# Patient Record
Sex: Female | Born: 1975 | Race: Black or African American | Hispanic: No | Smoking: Never smoker
Health system: Southern US, Community
[De-identification: ages and names within clinical notes are randomized; demographics above are authoritative.]

## PROBLEM LIST (undated history)

## (undated) ENCOUNTER — Emergency Department (HOSPITAL_COMMUNITY): Payer: 59

## (undated) DIAGNOSIS — Z9889 Other specified postprocedural states: Secondary | ICD-10-CM

## (undated) DIAGNOSIS — M5430 Sciatica, unspecified side: Secondary | ICD-10-CM

## (undated) DIAGNOSIS — F419 Anxiety disorder, unspecified: Secondary | ICD-10-CM

## (undated) DIAGNOSIS — R519 Headache, unspecified: Secondary | ICD-10-CM

## (undated) DIAGNOSIS — R51 Headache: Secondary | ICD-10-CM

## (undated) DIAGNOSIS — J45909 Unspecified asthma, uncomplicated: Secondary | ICD-10-CM

## (undated) DIAGNOSIS — T7840XA Allergy, unspecified, initial encounter: Secondary | ICD-10-CM

## (undated) DIAGNOSIS — D649 Anemia, unspecified: Secondary | ICD-10-CM

## (undated) DIAGNOSIS — Z9289 Personal history of other medical treatment: Secondary | ICD-10-CM

## (undated) DIAGNOSIS — R112 Nausea with vomiting, unspecified: Secondary | ICD-10-CM

## (undated) DIAGNOSIS — I10 Essential (primary) hypertension: Secondary | ICD-10-CM

## (undated) HISTORY — PX: LAPAROSCOPY: SHX197

---

## 2008-04-17 ENCOUNTER — Emergency Department: Payer: Self-pay | Admitting: Emergency Medicine

## 2008-08-22 ENCOUNTER — Emergency Department (HOSPITAL_COMMUNITY): Admission: EM | Admit: 2008-08-22 | Discharge: 2008-08-22 | Payer: Self-pay | Admitting: Emergency Medicine

## 2011-05-15 ENCOUNTER — Emergency Department: Payer: Self-pay | Admitting: Internal Medicine

## 2014-02-20 ENCOUNTER — Emergency Department (HOSPITAL_COMMUNITY)
Admission: EM | Admit: 2014-02-20 | Discharge: 2014-02-20 | Disposition: A | Discharge status: Home / Self Care | Payer: 59 | Attending: Emergency Medicine | Admitting: Emergency Medicine

## 2014-02-20 ENCOUNTER — Encounter (HOSPITAL_COMMUNITY): Payer: Self-pay | Admitting: Emergency Medicine

## 2014-02-20 DIAGNOSIS — F19939 Other psychoactive substance use, unspecified with withdrawal, unspecified: Secondary | ICD-10-CM | POA: Insufficient documentation

## 2014-02-20 DIAGNOSIS — F419 Anxiety disorder, unspecified: Secondary | ICD-10-CM

## 2014-02-20 DIAGNOSIS — K219 Gastro-esophageal reflux disease without esophagitis: Secondary | ICD-10-CM | POA: Insufficient documentation

## 2014-02-20 DIAGNOSIS — R519 Headache, unspecified: Secondary | ICD-10-CM

## 2014-02-20 DIAGNOSIS — M5430 Sciatica, unspecified side: Secondary | ICD-10-CM | POA: Insufficient documentation

## 2014-02-20 DIAGNOSIS — I10 Essential (primary) hypertension: Secondary | ICD-10-CM | POA: Insufficient documentation

## 2014-02-20 DIAGNOSIS — Z79899 Other long term (current) drug therapy: Secondary | ICD-10-CM | POA: Insufficient documentation

## 2014-02-20 DIAGNOSIS — Z7982 Long term (current) use of aspirin: Secondary | ICD-10-CM | POA: Insufficient documentation

## 2014-02-20 DIAGNOSIS — Z885 Allergy status to narcotic agent status: Secondary | ICD-10-CM | POA: Insufficient documentation

## 2014-02-20 DIAGNOSIS — R002 Palpitations: Secondary | ICD-10-CM | POA: Insufficient documentation

## 2014-02-20 DIAGNOSIS — R51 Headache: Secondary | ICD-10-CM | POA: Insufficient documentation

## 2014-02-20 DIAGNOSIS — Z881 Allergy status to other antibiotic agents status: Secondary | ICD-10-CM | POA: Insufficient documentation

## 2014-02-20 HISTORY — DX: Sciatica, unspecified side: M54.30

## 2014-02-20 HISTORY — DX: Anxiety disorder, unspecified: F41.9

## 2014-02-20 HISTORY — DX: Essential (primary) hypertension: I10

## 2014-02-20 LAB — CBC WITH DIFFERENTIAL/PLATELET
Basophils Absolute: 0 10*3/uL (ref 0.0–0.1)
Basophils Relative: 0 % (ref 0–1)
EOS ABS: 0.1 10*3/uL (ref 0.0–0.7)
Eosinophils Relative: 1 % (ref 0–5)
HCT: 38.1 % (ref 36.0–46.0)
HEMOGLOBIN: 13.5 g/dL (ref 12.0–15.0)
Lymphocytes Relative: 34 % (ref 12–46)
Lymphs Abs: 3.6 10*3/uL (ref 0.7–4.0)
MCH: 29.5 pg (ref 26.0–34.0)
MCHC: 35.4 g/dL (ref 30.0–36.0)
MCV: 83.4 fL (ref 78.0–100.0)
MONOS PCT: 7 % (ref 3–12)
Monocytes Absolute: 0.7 10*3/uL (ref 0.1–1.0)
NEUTROS PCT: 58 % (ref 43–77)
Neutro Abs: 6.2 10*3/uL (ref 1.7–7.7)
PLATELETS: 290 10*3/uL (ref 150–400)
RBC: 4.57 MIL/uL (ref 3.87–5.11)
RDW: 12.8 % (ref 11.5–15.5)
WBC: 10.6 10*3/uL — ABNORMAL HIGH (ref 4.0–10.5)

## 2014-02-20 LAB — COMPREHENSIVE METABOLIC PANEL
ALBUMIN: 4.2 g/dL (ref 3.5–5.2)
ALK PHOS: 53 U/L (ref 39–117)
ALT: 14 U/L (ref 0–35)
AST: 18 U/L (ref 0–37)
BILIRUBIN TOTAL: 0.5 mg/dL (ref 0.3–1.2)
BUN: 11 mg/dL (ref 6–23)
CHLORIDE: 98 meq/L (ref 96–112)
CO2: 22 mEq/L (ref 19–32)
Calcium: 10.6 mg/dL — ABNORMAL HIGH (ref 8.4–10.5)
Creatinine, Ser: 0.64 mg/dL (ref 0.50–1.10)
GFR calc Af Amer: >90 mL/min (ref >90)
GFR calc non Af Amer: >90 mL/min (ref >90)
Glucose, Bld: 128 mg/dL — ABNORMAL HIGH (ref 70–99)
Potassium: 3.5 mEq/L — ABNORMAL LOW (ref 3.7–5.3)
SODIUM: 139 meq/L (ref 137–147)
Total Protein: 8 g/dL (ref 6.0–8.3)

## 2014-02-20 MED ORDER — IBUPROFEN 400 MG PO TABS: 400.0000 mg | ORAL_TABLET | Freq: Four times a day (QID) | ORAL | Status: DC | PRN

## 2014-02-20 MED ORDER — OMEPRAZOLE 20 MG PO CPDR: 20.0000 mg | DELAYED_RELEASE_CAPSULE | Freq: Every day | ORAL | Status: DC

## 2014-02-20 MED ORDER — GI COCKTAIL ~~LOC~~
30.0000 mL | Freq: Once | ORAL | Status: AC
  Administered 2014-02-20: 30 mL via ORAL
  Filled 2014-02-20: qty 30

## 2014-02-20 MED ORDER — LORAZEPAM 1 MG PO TABS: 1.0000 mg | ORAL_TABLET | Freq: Three times a day (TID) | ORAL | Status: DC | PRN

## 2014-02-20 MED ORDER — IBUPROFEN 200 MG PO TABS
600.0000 mg | ORAL_TABLET | Freq: Once | ORAL | Status: AC
  Administered 2014-02-20: 600 mg via ORAL
  Filled 2014-02-20: qty 3

## 2014-02-20 NOTE — ED Notes (Signed)
Pt. woke up this morning with palpitations , hand tremors , dry mouth and anxiety , respirations unlabored / no chest pain .

## 2014-02-20 NOTE — Discharge Instructions (Signed)
Headaches, Frequently Asked Questions MIGRAINE HEADACHES Q: What is migraine? What causes it? How can I treat it? A: Generally, migraine headaches begin as a dull ache. Then they develop into a constant, throbbing, and pulsating pain. You may experience pain at the temples. You may experience pain at the front or back of one or both sides of the head. The pain is usually accompanied by a combination of:  Nausea.  Vomiting.  Sensitivity to light and noise. Some people (about 15%) experience an aura (see below) before an attack. The cause of migraine is believed to be chemical reactions in the brain. Treatment for migraine may include over-the-counter or prescription medications. It may also include self-help techniques. These include relaxation training and biofeedback.  Q: What is an aura? A: About 15% of people with migraine get an "aura". This is a sign of neurological symptoms that occur before a migraine headache. You may see wavy or jagged lines, dots, or flashing lights. You might experience tunnel vision or blind spots in one or both eyes. The aura can include visual or auditory hallucinations (something imagined). It may include disruptions in smell (such as strange odors), taste or touch. Other symptoms include:  Numbness.  A "pins and needles" sensation.  Difficulty in recalling or speaking the correct word. These neurological events may last as long as 60 minutes. These symptoms will fade as the headache begins. Q: What is a trigger? A: Certain physical or environmental factors can lead to or "trigger" a migraine. These include:  Foods.  Hormonal changes.  Weather.  Stress. It is important to remember that triggers are different for everyone. To help prevent migraine attacks, you need to figure out which triggers affect you. Keep a headache diary. This is a good way to track triggers. The diary will help you talk to your healthcare professional about your condition. Q: Does  weather affect migraines? A: Bright sunshine, hot, humid conditions, and drastic changes in barometric pressure may lead to, or "trigger," a migraine attack in some people. But studies have shown that weather does not act as a trigger for everyone with migraines. Q: What is the link between migraine and hormones? A: Hormones start and regulate many of your body's functions. Hormones keep your body in balance within a constantly changing environment. The levels of hormones in your body are unbalanced at times. Examples are during menstruation, pregnancy, or menopause. That can lead to a migraine attack. In fact, about three quarters of all women with migraine report that their attacks are related to the menstrual cycle.  Q: Is there an increased risk of stroke for migraine sufferers? A: The likelihood of a migraine attack causing a stroke is very remote. That is not to say that migraine sufferers cannot have a stroke associated with their migraines. In persons under age 53, the most common associated factor for stroke is migraine headache. But over the course of a person's normal life span, the occurrence of migraine headache may actually be associated with a reduced risk of dying from cerebrovascular disease due to stroke.  Q: What are acute medications for migraine? A: Acute medications are used to treat the pain of the headache after it has started. Examples over-the-counter medications, NSAIDs, ergots, and triptans.  Q: What are the triptans? A: Triptans are the newest class of abortive medications. They are specifically targeted to treat migraine. Triptans are vasoconstrictors. They moderate some chemical reactions in the brain. The triptans work on receptors in your brain. Triptans help  to restore the balance of a neurotransmitter called serotonin. Fluctuations in levels of serotonin are thought to be a main cause of migraine.  °Q: Are over-the-counter medications for migraine effective? °A:  Over-the-counter, or "OTC," medications may be effective in relieving mild to moderate pain and associated symptoms of migraine. But you should see your caregiver before beginning any treatment regimen for migraine.  °Q: What are preventive medications for migraine? °A: Preventive medications for migraine are sometimes referred to as "prophylactic" treatments. They are used to reduce the frequency, severity, and length of migraine attacks. Examples of preventive medications include antiepileptic medications, antidepressants, beta-blockers, calcium channel blockers, and NSAIDs (nonsteroidal anti-inflammatory drugs). °Q: Why are anticonvulsants used to treat migraine? °A: During the past few years, there has been an increased interest in antiepileptic drugs for the prevention of migraine. They are sometimes referred to as "anticonvulsants". Both epilepsy and migraine may be caused by similar reactions in the brain.  °Q: Why are antidepressants used to treat migraine? °A: Antidepressants are typically used to treat people with depression. They may reduce migraine frequency by regulating chemical levels, such as serotonin, in the brain.  °Q: What alternative therapies are used to treat migraine? °A: The term "alternative therapies" is often used to describe treatments considered outside the scope of conventional Western medicine. Examples of alternative therapy include acupuncture, acupressure, and yoga. Another common alternative treatment is herbal therapy. Some herbs are believed to relieve headache pain. Always discuss alternative therapies with your caregiver before proceeding. Some herbal products contain arsenic and other toxins. °TENSION HEADACHES °Q: What is a tension-type headache? What causes it? How can I treat it? °A: Tension-type headaches occur randomly. They are often the result of temporary stress, anxiety, fatigue, or anger. Symptoms include soreness in your temples, a tightening band-like sensation  around your head (a "vice-like" ache). Symptoms can also include a pulling feeling, pressure sensations, and contracting head and neck muscles. The headache begins in your forehead, temples, or the back of your head and neck. Treatment for tension-type headache may include over-the-counter or prescription medications. Treatment may also include self-help techniques such as relaxation training and biofeedback. °CLUSTER HEADACHES °Q: What is a cluster headache? What causes it? How can I treat it? °A: Cluster headache gets its name because the attacks come in groups. The pain arrives with little, if any, warning. It is usually on one side of the head. A tearing or bloodshot eye and a runny nose on the same side of the headache may also accompany the pain. Cluster headaches are believed to be caused by chemical reactions in the brain. They have been described as the most severe and intense of any headache type. Treatment for cluster headache includes prescription medication and oxygen. °SINUS HEADACHES °Q: What is a sinus headache? What causes it? How can I treat it? °A: When a cavity in the bones of the face and skull (a sinus) becomes inflamed, the inflammation will cause localized pain. This condition is usually the result of an allergic reaction, a tumor, or an infection. If your headache is caused by a sinus blockage, such as an infection, you will probably have a fever. An x-ray will confirm a sinus blockage. Your caregiver's treatment might include antibiotics for the infection, as well as antihistamines or decongestants.  °REBOUND HEADACHES °Q: What is a rebound headache? What causes it? How can I treat it? °A: A pattern of taking acute headache medications too often can lead to a condition known as "rebound headache."   A pattern of taking too much headache medication includes taking it more than 2 days per week or in excessive amounts. That means more than the label or a caregiver advises. With rebound  headaches, your medications not only stop relieving pain, they actually begin to cause headaches. Doctors treat rebound headache by tapering the medication that is being overused. Sometimes your caregiver will gradually substitute a different type of treatment or medication. Stopping may be a challenge. Regularly overusing a medication increases the potential for serious side effects. Consult a caregiver if you regularly use headache medications more than 2 days per week or more than the label advises. ADDITIONAL QUESTIONS AND ANSWERS Q: What is biofeedback? A: Biofeedback is a self-help treatment. Biofeedback uses special equipment to monitor your body's involuntary physical responses. Biofeedback monitors:  Breathing.  Pulse.  Heart rate.  Temperature.  Muscle tension.  Brain activity. Biofeedback helps you refine and perfect your relaxation exercises. You learn to control the physical responses that are related to stress. Once the technique has been mastered, you do not need the equipment any more. Q: Are headaches hereditary? A: Four out of five (80%) of people that suffer report a family history of migraine. Scientists are not sure if this is genetic or a family predisposition. Despite the uncertainty, a child has a 50% chance of having migraine if one parent suffers. The child has a 75% chance if both parents suffer.  Q: Can children get headaches? A: By the time they reach high school, most young people have experienced some type of headache. Many safe and effective approaches or medications can prevent a headache from occurring or stop it after it has begun.  Q: What type of doctor should I see to diagnose and treat my headache? A: Start with your primary caregiver. Discuss his or her experience and approach to headaches. Discuss methods of classification, diagnosis, and treatment. Your caregiver may decide to recommend you to a headache specialist, depending upon your symptoms or other  physical conditions. Having diabetes, allergies, etc., may require a more comprehensive and inclusive approach to your headache. The National Headache Foundation will provide, upon request, a list of [facility] physician members in your state. Document Released: 02/05/2004 Document Revised: 02/07/2012 Document Reviewed: 07/15/2008 [facility] Patient Information 2014 [place]. Generalized Anxiety Disorder Generalized anxiety disorder (GAD) is a mental disorder. It interferes with life functions, including relationships, work, and school. GAD is different from normal anxiety, which everyone experiences at some point in their lives in response to specific life events and activities. Normal anxiety actually helps [place] prepare for and get through these life events and activities. Normal anxiety goes away after the event or activity is over.  GAD causes anxiety that is not necessarily related to specific events or activities. It also causes excess anxiety in proportion to specific events or activities. The anxiety associated with GAD is also difficult to control. GAD can vary from mild to severe. People with severe GAD can have intense waves of anxiety with physical symptoms (panic attacks).  SYMPTOMS The anxiety and worry associated with GAD are difficult to control. This anxiety and worry are related to many life events and activities and also occur more days than not for 6 months or longer. People with GAD also have three or more of the following symptoms (one or more in children):  Restlessness.   Fatigue.  Difficulty concentrating.   Irritability.  Muscle tension.  Difficulty sleeping or unsatisfying sleep. DIAGNOSIS GAD is diagnosed through an  assessment by your caregiver. Your caregiver will ask you questions aboutyour mood,physical symptoms, and events in your life. Your caregiver may ask you about your medical history and use of alcohol or drugs, including prescription medications. Your  caregiver may also do a physical exam and blood tests. Certain medical conditions and the use of certain substances can cause symptoms similar to those associated with GAD. Your caregiver may refer you to a mental health specialist for further evaluation. TREATMENT The following therapies are usually used to treat GAD:   Medication Antidepressant medication usually is prescribed for long-term daily control. Antianxiety medications may be added in severe cases, especially when panic attacks occur.   Talk therapy (psychotherapy) Certain types of talk therapy can be helpful in treating GAD by providing support, education, and guidance. A form of talk therapy called cognitive behavioral therapy can teach you healthy ways to think about and react to daily life events and activities.  Stress managementtechniques These include yoga, meditation, and exercise and can be very helpful when they are practiced regularly. A mental health specialist can help determine which treatment is best for you. Some people see improvement with one therapy. However, other people require a combination of therapies. Document Released: 03/12/2013 Document Reviewed: 03/12/2013 [facility] Patient Information 2014 [place], [place]. Diet for Gastroesophageal Reflux Disease, Adult Reflux (acid reflux) is when acid from your stomach flows up into the esophagus. When acid comes in contact with the esophagus, the acid causes irritation and soreness (inflammation) in the esophagus. When reflux happens often or so severely that it causes damage to the esophagus, it is called gastroesophageal reflux disease (GERD). Nutrition therapy can help ease the discomfort of GERD. FOODS OR DRINKS TO AVOID OR LIMIT  Smoking or chewing tobacco. Nicotine is one of the most potent stimulants to acid production in the gastrointestinal tract.  Caffeinated and decaffeinated coffee and black tea.  Regular or low-calorie carbonated beverages or energy  drinks (caffeine-free carbonated beverages are allowed).   Strong spices, such as black pepper, white pepper, red pepper, cayenne, curry powder, and chili powder.  Peppermint or spearmint.  Chocolate.  High-fat foods, including meats and fried foods. Extra added fats including oils, butter, salad dressings, and nuts. Limit these to less than 8 tsp per day.  Fruits and vegetables if they are not tolerated, such as citrus fruits or tomatoes.  Alcohol.  Any food that seems to aggravate your condition. If you have questions regarding your diet, call your caregiver or a registered dietitian. OTHER THINGS THAT MAY HELP GERD INCLUDE:   Eating your meals slowly, in a relaxed setting.  Eating 5 to 6 small meals per day instead of 3 large meals.  Eliminating food for a period of time if it causes distress.  Not lying down until 3 hours after eating a meal.  Keeping the head of your bed raised 6 to 9 inches (15 to 23 cm) by using a foam wedge or blocks under the legs of the bed. Lying flat may make symptoms worse.  Being physically active. Weight loss may be helpful in reducing reflux in overweight or obese adults.  Wear loose fitting clothing EXAMPLE MEAL PLAN This meal plan is approximately 2,000 calories based on [email or web address] meal planning guidelines. Breakfast   cup cooked oatmeal.  1 cup strawberries.  1 cup low-fat milk.  1 oz almonds. Snack  1 cup cucumber slices.  6 oz yogurt (made from low-fat or fat-free milk). Lunch  2 slice whole-wheat bread.  2 oz  sliced [place].  2 tsp mayonnaise.  1 cup blueberries.  1 cup snap peas. Snack  6 whole-wheat crackers.  1 oz string cheese. Dinner   cup brown rice.  1 cup mixed veggies.  1 tsp olive oil.  3 oz grilled fish. Document Released: 11/15/2005 Document Revised: 02/07/2012 Document Reviewed: 10/01/2011 [facility] Patient Information 2014 [place], [place].

## 2014-02-21 NOTE — ED Provider Notes (Signed)
CSN: [id number]     Arrival date & time 02/20/14  0356 History   First MD Initiated Contact with Patient 02/20/14 [phone]     Chief Complaint  Patient presents with  . Palpitations  . Anxiety     (Consider location/radiation/quality/duration/timing/severity/associated sxs/prior Treatment) HPI Comments: Pt with hx pf HTN, anxiety - being weaned off of nortriptyline, comes in with cc of palpitations and tremors. States tat she she woke up in the middle of the sleep with palpitations. Pt is also feeling tremulous and jittery. Hx of anxiety - but typically she gets chest tightness.  Patient is a 38 y.o. female presenting with palpitations and anxiety. The history is provided by the patient.  Palpitations Associated symptoms: no chest pain, no dizziness, no nausea, no shortness of breath and no vomiting   Anxiety Pertinent negatives include no chest pain, no abdominal pain, no headaches and no shortness of breath.    Past Medical History  Diagnosis Date  . Hypertension   . Anxiety   . Sciatica    Past Surgical History  Procedure Laterality Date  . Cesarean section    . Laparoscopy     No family history on file. History  Substance Use Topics  . Smoking status: Never Smoker   . Smokeless tobacco: Not on file  . Alcohol Use: No   OB History   Grav Para Term Preterm Abortions TAB SAB Ect Mult Living                 Review of Systems  Constitutional: Positive for activity change.  Respiratory: Negative for chest tightness and shortness of breath.   Cardiovascular: Positive for palpitations. Negative for chest pain.  Gastrointestinal: Negative for nausea, vomiting and abdominal pain.  Genitourinary: Negative for dysuria.  Musculoskeletal: Negative for neck pain.  Neurological: Negative for dizziness and headaches.  Psychiatric/Behavioral: The patient is nervous/anxious.       Allergies  Shellfish allergy; Codeine; Doxycycline; and Cefzil  Home Medications   Current  Outpatient Rx  Name  Route  Sig  Dispense  Refill  . aspirin EC 81 MG tablet   Oral   Take 200 mg by mouth daily.         . cetirizine (ZYRTEC) 10 MG tablet   Oral   Take 10 mg by mouth daily.         Marland Kitchen etodolac (LODINE) 500 MG tablet   Oral   Take 500 mg by mouth 2 (two) times daily.          Marland Kitchen VITAMIN D, CHOLECALCIFEROL, PO   Oral   Take 5,000 Units by mouth 2 (two) times daily.         Marland Kitchen ibuprofen (ADVIL,MOTRIN) 400 MG tablet   Oral   Take 1 tablet (400 mg total) by mouth every 6 (six) hours as needed.   30 tablet   0   . LORazepam (ATIVAN) 1 MG tablet   Oral   Take 1 tablet (1 mg total) by mouth 3 (three) times daily as needed for anxiety.   10 tablet   0   . nortriptyline (PAMELOR) 25 MG capsule   Oral   Take 25 mg by mouth at bedtime.          Marland Kitchen omeprazole (PRILOSEC) 20 MG capsule   Oral   Take 1 capsule (20 mg total) by mouth daily.   30 capsule   0    BP 138/97  Pulse 81  Temp(Src) 98.4 F (36.9  C) (Oral)  Resp 16  SpO2 98%  LMP 02/20/2014 Physical Exam  Nursing note and vitals reviewed. Constitutional: She is oriented to person, place, and time. She appears well-developed and well-nourished.  HENT:  Head: Normocephalic and atraumatic.  Eyes: EOM are normal. Pupils are equal, round, and reactive to light.  Neck: Neck supple.  Cardiovascular: Normal rate, regular rhythm and normal heart sounds.   No murmur heard. Pulmonary/Chest: Effort normal. No respiratory distress.  Abdominal: Soft. She exhibits no distension. There is no tenderness. There is no rebound and no guarding.  Neurological: She is alert and oriented to person, place, and time.  Skin: Skin is warm and dry.    ED Course  Procedures (including critical care time) Labs Review Labs Reviewed  CBC WITH DIFFERENTIAL - Abnormal; Notable for the following:    WBC 10.6 (*)    All other components within normal limits  COMPREHENSIVE METABOLIC PANEL - Abnormal; Notable for the  following:    Potassium 3.5 (*)    Glucose, Bld 128 (*)    Calcium 10.6 (*)    All other components within normal limits   Imaging Review No results found.   EKG Interpretation   Date/Time:  Wednesday February 20 2014 04:00:45 EDT Ventricular Rate:  143 PR Interval:  126 QRS Duration: 76 QT Interval:  292 QTC Calculation: 450 R Axis:   78 Text Interpretation:  Sinus tachycardia with occasional Premature  ventricular complexes Otherwise normal ECG Confirmed by Cashlynn Yearwood, MD,  Thelma Comp [phone]) on 02/20/2014 5:57:20 AM      MDM   Final diagnoses:  Anxiety  Medication withdrawal  Headache  GERD (gastroesophageal reflux disease)    Pt comes in with cc of anxiety attack. She has hx of anxiety - sx atypical for her though. Of note however, she is being weaned off of nortriptyline - and the withdrawals can present with anxiety.  During my evaluation, she is no longer tachycardic - however, when she first arrived, her HR was in the 140s. She feels a lot better - but also complains of some heart burn. No hx of PE, DVT, and no risk factors for the same, also no hx of thyroid disorders, substance or stimulant abuse.  Varney Biles, MD 02/21/14 [phone]

## 2016-08-17 ENCOUNTER — Encounter: Payer: Self-pay | Admitting: Emergency Medicine

## 2016-08-17 ENCOUNTER — Emergency Department
Admission: EM | Admit: 2016-08-17 | Discharge: 2016-08-17 | Disposition: A | Discharge status: Home / Self Care | Payer: 59 | Attending: Emergency Medicine | Admitting: Emergency Medicine

## 2016-08-17 ENCOUNTER — Emergency Department: Payer: 59

## 2016-08-17 DIAGNOSIS — Z79899 Other long term (current) drug therapy: Secondary | ICD-10-CM | POA: Insufficient documentation

## 2016-08-17 DIAGNOSIS — R109 Unspecified abdominal pain: Secondary | ICD-10-CM

## 2016-08-17 DIAGNOSIS — Z7982 Long term (current) use of aspirin: Secondary | ICD-10-CM | POA: Diagnosis not present

## 2016-08-17 DIAGNOSIS — Z791 Long term (current) use of non-steroidal anti-inflammatories (NSAID): Secondary | ICD-10-CM | POA: Insufficient documentation

## 2016-08-17 DIAGNOSIS — R319 Hematuria, unspecified: Secondary | ICD-10-CM | POA: Diagnosis not present

## 2016-08-17 DIAGNOSIS — R1032 Left lower quadrant pain: Secondary | ICD-10-CM | POA: Diagnosis not present

## 2016-08-17 DIAGNOSIS — I10 Essential (primary) hypertension: Secondary | ICD-10-CM | POA: Insufficient documentation

## 2016-08-17 DIAGNOSIS — R3915 Urgency of urination: Secondary | ICD-10-CM | POA: Diagnosis not present

## 2016-08-17 LAB — URINALYSIS COMPLETE WITH MICROSCOPIC (ARMC ONLY)
Bacteria, UA: NONE SEEN
Bilirubin Urine: NEGATIVE
GLUCOSE, UA: NEGATIVE mg/dL
Ketones, ur: NEGATIVE mg/dL
Leukocytes, UA: NEGATIVE
NITRITE: NEGATIVE
Protein, ur: NEGATIVE mg/dL
SPECIFIC GRAVITY, URINE: 1.023 (ref 1.005–1.030)
pH: 5 (ref 5.0–8.0)

## 2016-08-17 LAB — PREGNANCY, URINE: Preg Test, Ur: NEGATIVE

## 2016-08-17 MED ORDER — KETOROLAC TROMETHAMINE 30 MG/ML IJ SOLN
INTRAMUSCULAR | Status: AC
  Filled 2016-08-17: qty 1

## 2016-08-17 MED ORDER — NAPROXEN 500 MG PO TABS
500.0000 mg | ORAL_TABLET | Freq: Two times a day (BID) | ORAL | 0 refills | Status: DC
Start: 2016-08-17 — End: 2016-10-15

## 2016-08-17 MED ORDER — ONDANSETRON 4 MG PO TBDP: 4.0000 mg | ORAL_TABLET | Freq: Three times a day (TID) | ORAL | 0 refills | Status: DC | PRN

## 2016-08-17 MED ORDER — KETOROLAC TROMETHAMINE 60 MG/2ML IM SOLN
15.0000 mg | Freq: Once | INTRAMUSCULAR | Status: AC
  Administered 2016-08-17: 15 mg via INTRAMUSCULAR

## 2016-08-17 NOTE — ED Notes (Signed)
Pt discharged to home.  Discharge instructions reviewed.  Verbalized understanding.  No questions or concerns at this time.  Teach back verified.  Pt in NAD.  No items left in ED.   

## 2016-08-17 NOTE — ED Notes (Signed)
Lab called for urine preg add on

## 2016-08-17 NOTE — Discharge Instructions (Signed)
Your CT scan revealed: IMPRESSION:  1. No renal, ureteral or bladder calculi or mass.  2. Scattered descending colon and sigmoid colon diverticulosis but  no findings for acute diverticulitis.  3. 5 cm fibroid with mild mass effect on the endometrium.  4. Simple appearing left ovarian cyst.  5. Normal terminal ileum and appendix.  6. Markedly contracted gallbladder.   Please follow up with your doctor for continued monitoring of your symptoms.

## 2016-08-17 NOTE — ED Notes (Addendum)
Pt states left lower abd/ flank pain, denies any urinary symptoms, pt awake and alert in no acute distress

## 2016-08-17 NOTE — ED Provider Notes (Signed)
[facility] Emergency Department Provider Note  ____________________________________________  Time seen: Approximately 7:04 PM  I have reviewed the triage vital signs and the nursing notes.   HISTORY  Chief Complaint Flank Pain    HPI Allison Wade is a 40 y.o. female who complains of left flank pain that started yesterday, constant waxing and waning, radiating to the left lower quadrant. Associated with some urinary urgency but no dysuria. No fevers chills or sweats. No nausea vomiting or diarrhea. Pain is sharp. No aggravating or alleviating factors.     Past Medical History:  Diagnosis Date  . Anxiety   . Hypertension   . Sciatica      There are no active problems to display for this patient.    Past Surgical History:  Procedure Laterality Date  . CESAREAN SECTION    . LAPAROSCOPY       Prior to Admission medications   Medication Sig Start Date End Date Taking? Authorizing Provider  aspirin EC 81 MG tablet Take 200 mg by mouth daily.    Historical Provider, MD  cetirizine (ZYRTEC) 10 MG tablet Take 10 mg by mouth daily.    Historical Provider, MD  etodolac (LODINE) 500 MG tablet Take 500 mg by mouth 2 (two) times daily.  01/29/14   Historical Provider, MD  ibuprofen (ADVIL,MOTRIN) 400 MG tablet Take 1 tablet (400 mg total) by mouth every 6 (six) hours as needed. 02/20/14   Varney Biles, MD  LORazepam (ATIVAN) 1 MG tablet Take 1 tablet (1 mg total) by mouth 3 (three) times daily as needed for anxiety. 02/20/14   Varney Biles, MD  naproxen (NAPROSYN) 500 MG tablet Take 1 tablet (500 mg total) by mouth 2 (two) times daily with a meal. 08/17/16   Carrie Mew, MD  nortriptyline (PAMELOR) 25 MG capsule Take 25 mg by mouth at bedtime.  02/12/14   Historical Provider, MD  omeprazole (PRILOSEC) 20 MG capsule Take 1 capsule (20 mg total) by mouth daily. 02/20/14   Varney Biles, MD  ondansetron (ZOFRAN ODT) 4 MG disintegrating tablet Take 1  tablet (4 mg total) by mouth every 8 (eight) hours as needed for nausea or vomiting. 08/17/16   Carrie Mew, MD  VITAMIN D, CHOLECALCIFEROL, PO Take 5,000 Units by mouth 2 (two) times daily.    Historical Provider, MD     Allergies Shellfish allergy; Codeine; Doxycycline; and Cefzil [cefprozil]   History reviewed. No pertinent family history.  Social History Social History  Substance Use Topics  . Smoking status: Never Smoker  . Smokeless tobacco: Never Used  . Alcohol use No    Review of Systems  Constitutional:   No fever or chills.   Cardiovascular:   No chest pain. Respiratory:   No dyspnea or cough. Gastrointestinal:   Left flank pain as above without vomiting and diarrhea.  Genitourinary:   Negative for dysuria or difficulty urinating.positive urgency  10-point ROS otherwise negative.  ____________________________________________   PHYSICAL EXAM:  VITAL SIGNS: ED Triage Vitals  Enc Vitals Group     BP 08/17/16 1527 (!) 148/94     Pulse Rate 08/17/16 1527 85     Resp 08/17/16 1527 18     Temp 08/17/16 1527 98.4 F (36.9 C)     Temp Source 08/17/16 1527 Oral     SpO2 08/17/16 1527 100 %     Weight 08/17/16 1520 193 lb (87.5 kg)     Height 08/17/16 1520 5\' 6"  (1.676 m)  Head Circumference --      Peak Flow --      Pain Score 08/17/16 1521 8     Pain Loc --      Pain Edu? --      Excl. in [place]? --     Vital signs reviewed, nursing assessments reviewed.   Constitutional:   Alert and oriented. Well appearing and in no distress. Eyes:   No scleral icterus. No conjunctival pallor. PERRL. EOMI.  No nystagmus. ENT   Head:   Normocephalic and atraumatic.   Nose:   No congestion/rhinnorhea. No septal hematoma   Mouth/Throat:   MMM, no pharyngeal erythema. No peritonsillar mass.    Neck:   No stridor. No SubQ emphysema. No meningismus. Hematological/Lymphatic/Immunilogical:   No cervical lymphadenopathy. Cardiovascular:   RRR. Symmetric  bilateral radial and DP pulses.  No murmurs.  Respiratory:   Normal respiratory effort without tachypnea nor retractions. Breath sounds are clear and equal bilaterally. No wheezes/rales/rhonchi. Gastrointestinal:   Soft with mild left lower quadrant tendernesser. Non distended. There is no CVA tenderness.  No rebound, rigidity, or guarding. Genitourinary:   deferred Musculoskeletal:   Nontender with normal range of motion in all extremities. No joint effusions.  No lower extremity tenderness.  No edema. Neurologic:   Normal speech and language.  CN 2-10 normal. Motor grossly intact. No gross focal neurologic deficits are appreciated.  Skin:    Skin is warm, dry and intact. No rash noted.  No petechiae, purpura, or bullae.  ____________________________________________    LABS (pertinent positives/negatives) (all labs ordered are listed, but only abnormal results are displayed) Labs Reviewed  URINALYSIS COMPLETEWITH MICROSCOPIC ([place]) - Abnormal; Notable for the following:       Result Value   Color, Urine YELLOW (*)    APPearance CLEAR (*)    Hgb urine dipstick 3+ (*)    Squamous Epithelial / LPF 0-5 (*)    All other components within normal limits  URINE CULTURE  PREGNANCY, URINE   ____________________________________________   EKG    ____________________________________________    RADIOLOGY  CT abdomen and pelvis reveals a uterine fibroid, otherwise unremarkable.  ____________________________________________   PROCEDURES Procedures  ____________________________________________   INITIAL IMPRESSION / ASSESSMENT AND PLAN / ED COURSE  Pertinent labs & imaging results that were available during my care of the patient were reviewed by me and considered in my medical decision making (see chart for details).  Patient well appearing no acute distress. Presents with left flank and left lower quadrant abdominal pain. Found to have a uterine fibroid and microscopic  hematuria on workup. Exam is otherwise reassuring, vital signs unremarkable, patient very well appearing and in good spirits. We'll have the patient take NSAIDs for her pain, follow-up with primary care. Informed about hematuria and need for repeat testing once symptoms have resolved. Informed about CT findings. Return precautions given.     Clinical Course   ____________________________________________   FINAL CLINICAL IMPRESSION(S) / ED DIAGNOSES  Final diagnoses:  Left flank pain  Hematuria       Portions of this note were generated with dragon dictation software. Dictation errors may occur despite best attempts at proofreading.    Carrie Mew, MD 08/17/16 [phone]

## 2016-08-17 NOTE — ED Triage Notes (Signed)
Pt to ed with c/o left flank pain that radiates to left groin area.  Pt reports acute onset last night.

## 2016-08-19 LAB — URINE CULTURE: Culture: NO GROWTH

## 2016-09-02 ENCOUNTER — Other Ambulatory Visit: Payer: Self-pay | Admitting: Gynecology

## 2016-09-02 DIAGNOSIS — R928 Other abnormal and inconclusive findings on diagnostic imaging of breast: Secondary | ICD-10-CM

## 2016-09-07 ENCOUNTER — Ambulatory Visit
Admission: RE | Admit: 2016-09-07 | Discharge: 2016-09-07 | Disposition: A | Discharge status: Home / Self Care | Payer: 59 | Source: Ambulatory Visit | Attending: Gynecology | Admitting: Gynecology

## 2016-09-07 ENCOUNTER — Other Ambulatory Visit: Payer: Self-pay | Admitting: Gynecology

## 2016-09-07 DIAGNOSIS — R928 Other abnormal and inconclusive findings on diagnostic imaging of breast: Secondary | ICD-10-CM

## 2016-09-07 DIAGNOSIS — N6310 Unspecified lump in the right breast, unspecified quadrant: Secondary | ICD-10-CM

## 2016-09-08 ENCOUNTER — Other Ambulatory Visit: Payer: Self-pay | Admitting: Gynecology

## 2016-09-08 DIAGNOSIS — N6310 Unspecified lump in the right breast, unspecified quadrant: Secondary | ICD-10-CM

## 2016-09-09 ENCOUNTER — Ambulatory Visit
Admission: RE | Admit: 2016-09-09 | Discharge: 2016-09-09 | Disposition: A | Discharge status: Home / Self Care | Payer: 59 | Source: Ambulatory Visit | Attending: Gynecology | Admitting: Gynecology

## 2016-09-09 DIAGNOSIS — N6310 Unspecified lump in the right breast, unspecified quadrant: Secondary | ICD-10-CM

## 2016-10-19 NOTE — Patient Instructions (Signed)
Your procedure is scheduled on:  Wednesday, Dec. 6, 2017  Enter through the [employer] [facility] at:  6:00 AM  Pick up the phone at the desk and dial [phone].  Call this number if you have problems the morning of surgery: [phone].  Remember: Do NOT eat food or drink after:  Midnight Tuesday, Dec. 5, 2017  Take these medicines the morning of surgery with a SIP OF WATER:  None  Stop ALL herbal medications at this time   Do NOT wear jewelry (body piercing), metal hair clips/bobby pins, make-up, or nail polish. Do NOT wear lotions, powders, or perfumes.  You may wear deodorant. Do NOT shave for 48 hours prior to surgery. Do NOT bring valuables to the hospital. Contacts, dentures, or bridgework may not be worn into surgery.  Leave suitcase in car.  After surgery it may be brought to your room.  For patients admitted to the hospital, checkout time is 11:00 AM the day of discharge.

## 2016-10-20 ENCOUNTER — Encounter (HOSPITAL_COMMUNITY): Payer: Self-pay

## 2016-10-20 ENCOUNTER — Encounter (INDEPENDENT_AMBULATORY_CARE_PROVIDER_SITE_OTHER): Payer: Self-pay

## 2016-10-20 ENCOUNTER — Encounter (HOSPITAL_COMMUNITY)
Admission: RE | Admit: 2016-10-20 | Discharge: 2016-10-20 | Disposition: A | Discharge status: Home / Self Care | Payer: 59 | Source: Ambulatory Visit | Attending: Obstetrics and Gynecology | Admitting: Obstetrics and Gynecology

## 2016-10-20 DIAGNOSIS — Z01812 Encounter for preprocedural laboratory examination: Secondary | ICD-10-CM | POA: Insufficient documentation

## 2016-10-20 HISTORY — DX: Anemia, unspecified: D64.9

## 2016-10-20 HISTORY — DX: Headache: R51

## 2016-10-20 HISTORY — DX: Headache, unspecified: R51.9

## 2016-10-20 HISTORY — DX: Personal history of other medical treatment: Z92.89

## 2016-10-20 HISTORY — DX: Nausea with vomiting, unspecified: Z98.890

## 2016-10-20 HISTORY — DX: Allergy, unspecified, initial encounter: T78.40XA

## 2016-10-20 HISTORY — DX: Other specified postprocedural states: R11.2

## 2016-10-20 LAB — TYPE AND SCREEN
ABO/RH(D): O POS
Antibody Screen: NEGATIVE

## 2016-10-20 LAB — CBC
HCT: 30.1 % — ABNORMAL LOW (ref 36.0–46.0)
Hemoglobin: 9.5 g/dL — ABNORMAL LOW (ref 12.0–15.0)
MCH: 22.8 pg — AB (ref 26.0–34.0)
MCHC: 31.6 g/dL (ref 30.0–36.0)
MCV: 72.4 fL — AB (ref 78.0–100.0)
PLATELETS: 269 10*3/uL (ref 150–400)
RBC: 4.16 MIL/uL (ref 3.87–5.11)
RDW: 15.5 % (ref 11.5–15.5)
WBC: 9.2 10*3/uL (ref 4.0–10.5)

## 2016-10-20 LAB — BASIC METABOLIC PANEL
Anion gap: 5 (ref 5–15)
BUN: 14 mg/dL (ref 6–20)
CHLORIDE: 108 mmol/L (ref 101–111)
CO2: 25 mmol/L (ref 22–32)
CREATININE: 0.68 mg/dL (ref 0.44–1.00)
Calcium: 9.2 mg/dL (ref 8.9–10.3)
GFR calc Af Amer: >60 mL/min (ref >60)
GFR calc non Af Amer: >60 mL/min (ref >60)
Glucose, Bld: 101 mg/dL — ABNORMAL HIGH (ref 65–99)
Potassium: 3.9 mmol/L (ref 3.5–5.1)
SODIUM: 138 mmol/L (ref 135–145)

## 2016-10-20 LAB — ABO/RH: ABO/RH(D): O POS

## 2016-11-02 MED ORDER — GENTAMICIN SULFATE 40 MG/ML IJ SOLN
INTRAVENOUS | Status: AC
  Administered 2016-11-03: 115 mL via INTRAVENOUS
  Filled 2016-11-02: qty 9

## 2016-11-02 NOTE — H&P (Signed)
Allison Wade is an 40 y.o. female. She was seen in September in our office by Lesia Hausen, NP as a new patient.  Recently had abdominal pain and was seen at [facility]- had CT-showed 3.5cm fibroid. Hx of cyst. Mother and sister had fibroids with hysterectomy. She is not having abdominal pain now. She reports monthly cycles that last 7-9 days with clots and cramping.  She is not opposed to surgery but would consider other treatments. Has 3 children, hx of 2 C-Sections and BTL. Is tired of bleeding.  She had saline infusion ultrasound in the office, normal endometrial cavity but has a 3 cm intramural myoma, endometrial biopsy was benign.  All medical and surgical options were discussed, she wants to proceed with definitive surgical treatment.  Pertinent Gynecological History: Last mammogram: abnormal: needed f/u right breast Date: 07/2016 Last pap: normal Date: 07/2016 OB History: G2, P2002   Menstrual History: Patient's last menstrual period was 09/26/2016 (approximate).    Past Medical History:  Diagnosis Date  . Allergy   . Anemia   . Anxiety   . Headache    sinus induced migraines  . History of blood transfusion   . Hypertension    no medication, patient stated blood pressure was elevated at MD's office after hearing about the death of her best friend that morning  . PONV (postoperative nausea and vomiting)   . Sciatica     Past Surgical History:  Procedure Laterality Date  . CESAREAN SECTION    . LAPAROSCOPY      No family history on file.  Social History:  reports that she has never smoked. She has never used smokeless tobacco. She reports that she does not drink alcohol or use drugs.  Allergies:  Allergies  Allergen Reactions  . Codeine Anaphylaxis and Itching  . Doxycycline Anaphylaxis and Itching  . Shellfish Allergy Shortness Of Breath    Lips tingle  . Hydrocodone Itching    Makes pt feel spacey  . Cefzil [Cefprozil] Rash    No prescriptions prior to  admission.    Review of Systems  Respiratory: Negative.   Cardiovascular: Negative.   Gastrointestinal: Negative.   Genitourinary: Negative.     Height 5' 6.5" (1.689 m), weight 88.2 kg (194 lb 8 oz), last menstrual period 09/26/2016. Physical Exam  Constitutional: She appears well-developed and well-nourished.  Neck: Neck supple. No thyromegaly present.  Cardiovascular: Normal rate, regular rhythm and normal heart sounds.   No murmur heard. Respiratory: Effort normal and breath sounds normal. No respiratory distress. She has no wheezes.  GI: Soft. She exhibits no distension and no mass. There is no tenderness.  Transverse scar  Genitourinary: Vagina normal and uterus normal.    No results found for this or any previous visit (from the past 24 hour(s)).  No results found.  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  All medical and surgical options have been discussed.  Discussed that endometrial ablation could potentially make pain with menses worse.  Discussed surgical options, procedure and risks.  Will admit for TLH with bilateral salpingectomy.  Alanii Ramer D 11/02/2016, 5:23 PM

## 2016-11-03 ENCOUNTER — Encounter (HOSPITAL_COMMUNITY): Payer: Self-pay

## 2016-11-03 ENCOUNTER — Encounter (HOSPITAL_COMMUNITY)
Admission: RE | Disposition: A | Discharge status: Home / Self Care | Payer: Self-pay | Source: Ambulatory Visit | Attending: Obstetrics and Gynecology

## 2016-11-03 ENCOUNTER — Ambulatory Visit (HOSPITAL_COMMUNITY)
Admission: RE | Admit: 2016-11-03 | Discharge: 2016-11-04 | Disposition: A | Discharge status: Home / Self Care | Payer: 59 | Source: Ambulatory Visit | Attending: Obstetrics and Gynecology | Admitting: Obstetrics and Gynecology

## 2016-11-03 ENCOUNTER — Ambulatory Visit (HOSPITAL_COMMUNITY): Payer: 59 | Admitting: Anesthesiology

## 2016-11-03 DIAGNOSIS — N920 Excessive and frequent menstruation with regular cycle: Secondary | ICD-10-CM | POA: Diagnosis not present

## 2016-11-03 DIAGNOSIS — E669 Obesity, unspecified: Secondary | ICD-10-CM | POA: Insufficient documentation

## 2016-11-03 DIAGNOSIS — Z91013 Allergy to seafood: Secondary | ICD-10-CM | POA: Diagnosis not present

## 2016-11-03 DIAGNOSIS — N946 Dysmenorrhea, unspecified: Secondary | ICD-10-CM | POA: Insufficient documentation

## 2016-11-03 DIAGNOSIS — Z90710 Acquired absence of both cervix and uterus: Secondary | ICD-10-CM | POA: Diagnosis present

## 2016-11-03 DIAGNOSIS — M5440 Lumbago with sciatica, unspecified side: Secondary | ICD-10-CM | POA: Insufficient documentation

## 2016-11-03 DIAGNOSIS — Z885 Allergy status to narcotic agent status: Secondary | ICD-10-CM | POA: Insufficient documentation

## 2016-11-03 DIAGNOSIS — D251 Intramural leiomyoma of uterus: Secondary | ICD-10-CM | POA: Insufficient documentation

## 2016-11-03 DIAGNOSIS — D649 Anemia, unspecified: Secondary | ICD-10-CM | POA: Diagnosis not present

## 2016-11-03 DIAGNOSIS — Z888 Allergy status to other drugs, medicaments and biological substances status: Secondary | ICD-10-CM | POA: Insufficient documentation

## 2016-11-03 DIAGNOSIS — Z6830 Body mass index (BMI) 30.0-30.9, adult: Secondary | ICD-10-CM | POA: Diagnosis not present

## 2016-11-03 DIAGNOSIS — I10 Essential (primary) hypertension: Secondary | ICD-10-CM | POA: Insufficient documentation

## 2016-11-03 DIAGNOSIS — N72 Inflammatory disease of cervix uteri: Secondary | ICD-10-CM | POA: Insufficient documentation

## 2016-11-03 DIAGNOSIS — F419 Anxiety disorder, unspecified: Secondary | ICD-10-CM | POA: Insufficient documentation

## 2016-11-03 DIAGNOSIS — Z881 Allergy status to other antibiotic agents status: Secondary | ICD-10-CM | POA: Insufficient documentation

## 2016-11-03 HISTORY — PX: LAPAROSCOPIC HYSTERECTOMY: SHX1926

## 2016-11-03 LAB — PREGNANCY, URINE: Preg Test, Ur: NEGATIVE

## 2016-11-03 SURGERY — HYSTERECTOMY, TOTAL, LAPAROSCOPIC
Anesthesia: General | Site: Abdomen

## 2016-11-03 MED ORDER — ROCURONIUM BROMIDE 100 MG/10ML IV SOLN
INTRAVENOUS | Status: DC | PRN
  Administered 2016-11-03: 50 mg via INTRAVENOUS
  Administered 2016-11-03: 10 mg via INTRAVENOUS

## 2016-11-03 MED ORDER — DEXTROSE-NACL 5-0.45 % IV SOLN
INTRAVENOUS | Status: DC
  Administered 2016-11-03: 16:00:00 via INTRAVENOUS

## 2016-11-03 MED ORDER — LIDOCAINE HCL (CARDIAC) 20 MG/ML IV SOLN
INTRAVENOUS | Status: DC | PRN
  Administered 2016-11-03: 50 mg via INTRAVENOUS

## 2016-11-03 MED ORDER — HYDROMORPHONE HCL 1 MG/ML IJ SOLN
INTRAMUSCULAR | Status: AC
  Filled 2016-11-03: qty 1

## 2016-11-03 MED ORDER — DEXAMETHASONE SODIUM PHOSPHATE 4 MG/ML IJ SOLN
INTRAMUSCULAR | Status: AC
  Filled 2016-11-03: qty 1

## 2016-11-03 MED ORDER — HYDROMORPHONE HCL 1 MG/ML IJ SOLN
1.0000 mg | INTRAMUSCULAR | Status: DC | PRN
  Administered 2016-11-03 (×2): 1 mg via INTRAVENOUS
  Filled 2016-11-03 (×2): qty 1

## 2016-11-03 MED ORDER — MENTHOL 3 MG MT LOZG
1.0000 | LOZENGE | OROMUCOSAL | Status: DC | PRN
  Filled 2016-11-03: qty 9

## 2016-11-03 MED ORDER — PROPOFOL 10 MG/ML IV BOLUS
INTRAVENOUS | Status: AC
  Filled 2016-11-03: qty 20

## 2016-11-03 MED ORDER — LACTATED RINGERS IR SOLN
Status: DC | PRN
  Administered 2016-11-03: 3000 mL

## 2016-11-03 MED ORDER — SUGAMMADEX SODIUM 200 MG/2ML IV SOLN
INTRAVENOUS | Status: AC
  Filled 2016-11-03: qty 2

## 2016-11-03 MED ORDER — METOCLOPRAMIDE HCL 5 MG/ML IJ SOLN: 10.0000 mg | Freq: Once | INTRAMUSCULAR | Status: DC | PRN

## 2016-11-03 MED ORDER — KETOROLAC TROMETHAMINE 30 MG/ML IJ SOLN
INTRAMUSCULAR | Status: AC
  Filled 2016-11-03: qty 1

## 2016-11-03 MED ORDER — SUGAMMADEX SODIUM 200 MG/2ML IV SOLN
INTRAVENOUS | Status: DC | PRN
  Administered 2016-11-03: 200 mg via INTRAVENOUS

## 2016-11-03 MED ORDER — KETOROLAC TROMETHAMINE 30 MG/ML IJ SOLN: 30.0000 mg | Freq: Once | INTRAMUSCULAR | Status: DC

## 2016-11-03 MED ORDER — IBUPROFEN 600 MG PO TABS
600.0000 mg | ORAL_TABLET | Freq: Four times a day (QID) | ORAL | Status: DC | PRN
  Administered 2016-11-04: 600 mg via ORAL
  Filled 2016-11-03: qty 1

## 2016-11-03 MED ORDER — SODIUM CHLORIDE 0.9 % IJ SOLN
INTRAMUSCULAR | Status: AC
  Filled 2016-11-03: qty 10

## 2016-11-03 MED ORDER — MIDAZOLAM HCL 2 MG/2ML IJ SOLN
INTRAMUSCULAR | Status: DC | PRN
  Administered 2016-11-03: 2 mg via INTRAVENOUS

## 2016-11-03 MED ORDER — ONDANSETRON HCL 4 MG/2ML IJ SOLN
INTRAMUSCULAR | Status: AC
  Filled 2016-11-03: qty 2

## 2016-11-03 MED ORDER — FENTANYL CITRATE (PF) 100 MCG/2ML IJ SOLN
INTRAMUSCULAR | Status: DC | PRN
  Administered 2016-11-03: 50 ug via INTRAVENOUS
  Administered 2016-11-03: 100 ug via INTRAVENOUS
  Administered 2016-11-03 (×2): 50 ug via INTRAVENOUS

## 2016-11-03 MED ORDER — SCOPOLAMINE 1 MG/3DAYS TD PT72
1.0000 | MEDICATED_PATCH | Freq: Once | TRANSDERMAL | Status: DC
  Administered 2016-11-03: 1.5 mg via TRANSDERMAL

## 2016-11-03 MED ORDER — HYDROMORPHONE HCL 2 MG PO TABS
2.0000 mg | ORAL_TABLET | ORAL | Status: DC | PRN
  Administered 2016-11-04: 2 mg via ORAL
  Filled 2016-11-03: qty 1

## 2016-11-03 MED ORDER — LIDOCAINE HCL (PF) 1 % IJ SOLN
INTRAMUSCULAR | Status: AC
  Filled 2016-11-03: qty 5

## 2016-11-03 MED ORDER — EPHEDRINE SULFATE 50 MG/ML IJ SOLN
INTRAMUSCULAR | Status: DC | PRN
  Administered 2016-11-03: 10 mg via INTRAVENOUS

## 2016-11-03 MED ORDER — BUPIVACAINE HCL (PF) 0.25 % IJ SOLN
INTRAMUSCULAR | Status: AC
  Filled 2016-11-03: qty 30

## 2016-11-03 MED ORDER — FENTANYL CITRATE (PF) 250 MCG/5ML IJ SOLN
INTRAMUSCULAR | Status: AC
  Filled 2016-11-03: qty 5

## 2016-11-03 MED ORDER — DEXAMETHASONE SODIUM PHOSPHATE 10 MG/ML IJ SOLN
INTRAMUSCULAR | Status: DC | PRN
  Administered 2016-11-03: 4 mg via INTRAVENOUS

## 2016-11-03 MED ORDER — ONDANSETRON HCL 4 MG/2ML IJ SOLN
4.0000 mg | Freq: Four times a day (QID) | INTRAMUSCULAR | Status: DC | PRN
  Administered 2016-11-03: 4 mg via INTRAVENOUS
  Filled 2016-11-03: qty 2

## 2016-11-03 MED ORDER — ALUM & MAG HYDROXIDE-SIMETH 200-200-20 MG/5ML PO SUSP: 30.0000 mL | ORAL | Status: DC | PRN

## 2016-11-03 MED ORDER — KETOROLAC TROMETHAMINE 30 MG/ML IJ SOLN
30.0000 mg | Freq: Four times a day (QID) | INTRAMUSCULAR | Status: DC
  Administered 2016-11-03 – 2016-11-04 (×2): 30 mg via INTRAVENOUS
  Filled 2016-11-03 (×2): qty 1

## 2016-11-03 MED ORDER — KETOROLAC TROMETHAMINE 30 MG/ML IJ SOLN: 30.0000 mg | Freq: Four times a day (QID) | INTRAMUSCULAR | Status: DC

## 2016-11-03 MED ORDER — PROPOFOL 10 MG/ML IV BOLUS
INTRAVENOUS | Status: DC | PRN
  Administered 2016-11-03: 200 mg via INTRAVENOUS

## 2016-11-03 MED ORDER — HYDROMORPHONE HCL 1 MG/ML IJ SOLN
INTRAMUSCULAR | Status: AC
  Administered 2016-11-03: 0.5 mg via INTRAVENOUS
  Filled 2016-11-03: qty 1

## 2016-11-03 MED ORDER — BUPIVACAINE HCL (PF) 0.25 % IJ SOLN
INTRAMUSCULAR | Status: DC | PRN
  Administered 2016-11-03: 10 mL

## 2016-11-03 MED ORDER — KETOROLAC TROMETHAMINE 30 MG/ML IJ SOLN
INTRAMUSCULAR | Status: DC | PRN
  Administered 2016-11-03: 30 mg via INTRAVENOUS

## 2016-11-03 MED ORDER — MIDAZOLAM HCL 2 MG/2ML IJ SOLN
INTRAMUSCULAR | Status: AC
  Filled 2016-11-03: qty 2

## 2016-11-03 MED ORDER — SODIUM CHLORIDE 0.9 % IJ SOLN
INTRAMUSCULAR | Status: DC | PRN
  Administered 2016-11-03: 10 mL

## 2016-11-03 MED ORDER — HYDROMORPHONE HCL 1 MG/ML IJ SOLN
0.2500 mg | INTRAMUSCULAR | Status: DC | PRN
  Administered 2016-11-03 (×2): 0.5 mg via INTRAVENOUS

## 2016-11-03 MED ORDER — SIMETHICONE 80 MG PO CHEW
80.0000 mg | CHEWABLE_TABLET | Freq: Four times a day (QID) | ORAL | Status: DC | PRN
  Administered 2016-11-04: 80 mg via ORAL
  Filled 2016-11-03: qty 1

## 2016-11-03 MED ORDER — ONDANSETRON HCL 4 MG PO TABS: 4.0000 mg | ORAL_TABLET | Freq: Four times a day (QID) | ORAL | Status: DC | PRN

## 2016-11-03 MED ORDER — HYDROMORPHONE HCL 1 MG/ML IJ SOLN
INTRAMUSCULAR | Status: DC | PRN
  Administered 2016-11-03 (×2): 0.5 mg via INTRAVENOUS

## 2016-11-03 MED ORDER — LACTATED RINGERS IV SOLN
INTRAVENOUS | Status: DC
  Administered 2016-11-03 (×3): via INTRAVENOUS

## 2016-11-03 MED ORDER — SCOPOLAMINE 1 MG/3DAYS TD PT72
MEDICATED_PATCH | TRANSDERMAL | Status: AC
  Administered 2016-11-03: 1.5 mg via TRANSDERMAL
  Filled 2016-11-03: qty 1

## 2016-11-03 SURGICAL SUPPLY — 50 items
BARRIER ADHS 3X4 INTERCEED (GAUZE/BANDAGES/DRESSINGS) IMPLANT
CABLE HIGH FREQUENCY MONO STRZ (ELECTRODE) IMPLANT
CLOTH BEACON ORANGE TIMEOUT ST (SAFETY) ×3 IMPLANT
COVER BACK TABLE 60X90IN (DRAPES) ×3 IMPLANT
COVER LIGHT HANDLE  1/PK (MISCELLANEOUS) ×4
COVER LIGHT HANDLE 1/PK (MISCELLANEOUS) ×2 IMPLANT
COVER MAYO STAND STRL (DRAPES) IMPLANT
DERMABOND ADVANCED (GAUZE/BANDAGES/DRESSINGS) ×2
DERMABOND ADVANCED .7 DNX12 (GAUZE/BANDAGES/DRESSINGS) ×1 IMPLANT
DEVICE SUTURE ENDOST 10MM (ENDOMECHANICALS) ×3 IMPLANT
DURAPREP 26ML APPLICATOR (WOUND CARE) IMPLANT
FILTER SMOKE EVAC LAPAROSHD (FILTER) ×3 IMPLANT
GLOVE BIO SURGEON STRL SZ8 (GLOVE) IMPLANT
GLOVE BIOGEL PI IND STRL 7.0 (GLOVE) ×3 IMPLANT
GLOVE BIOGEL PI IND STRL 8 (GLOVE) ×1 IMPLANT
GLOVE BIOGEL PI INDICATOR 7.0 (GLOVE) ×6
GLOVE BIOGEL PI INDICATOR 8 (GLOVE) ×2
GLOVE ORTHO TXT STRL SZ7.5 (GLOVE) ×3 IMPLANT
GOWN STRL REUS W/TWL LRG LVL3 (GOWN DISPOSABLE) ×6 IMPLANT
GOWN STRL REUS W/TWL XL LVL3 (GOWN DISPOSABLE) ×3 IMPLANT
LEGGING LITHOTOMY PAIR STRL (DRAPES) ×3 IMPLANT
NS IRRIG 1000ML POUR BTL (IV SOLUTION) ×3 IMPLANT
OCCLUDER COLPOPNEUMO (BALLOONS) ×3 IMPLANT
PACK LAVH (CUSTOM PROCEDURE TRAY) ×3 IMPLANT
PACK TRENDGUARD 450 HYBRID PRO (MISCELLANEOUS) IMPLANT
PACK TRENDGUARD 600 HYBRD PROC (MISCELLANEOUS) IMPLANT
PROTECTOR NERVE ULNAR (MISCELLANEOUS) ×6 IMPLANT
SCISSORS LAP 5X35 DISP (ENDOMECHANICALS) IMPLANT
SET CYSTO W/LG BORE CLAMP LF (SET/KITS/TRAYS/PACK) IMPLANT
SET IRRIG TUBING LAPAROSCOPIC (IRRIGATION / IRRIGATOR) ×3 IMPLANT
SHEARS HARMONIC ACE PLUS 36CM (ENDOMECHANICALS) ×3 IMPLANT
SLEEVE XCEL OPT CAN 5 100 (ENDOMECHANICALS) ×3 IMPLANT
SUT ENDO VLOC 180-0-8IN (SUTURE) ×3 IMPLANT
SUT VIC AB 0 CT1 36 (SUTURE) ×6 IMPLANT
SUT VICRYL 0 UR6 27IN ABS (SUTURE) ×3 IMPLANT
SUT VICRYL 4-0 PS2 18IN ABS (SUTURE) ×3 IMPLANT
SYR TOOMEY 50ML (SYRINGE) IMPLANT
SYRINGE 10CC LL (SYRINGE) ×3 IMPLANT
TIP UTERINE 5.1X6CM LAV DISP (MISCELLANEOUS) ×3 IMPLANT
TIP UTERINE 6.7X10CM GRN DISP (MISCELLANEOUS) IMPLANT
TIP UTERINE 6.7X6CM WHT DISP (MISCELLANEOUS) IMPLANT
TIP UTERINE 6.7X8CM BLUE DISP (MISCELLANEOUS) IMPLANT
TOWEL OR 17X24 6PK STRL BLUE (TOWEL DISPOSABLE) ×6 IMPLANT
TRAY FOLEY CATH SILVER 14FR (SET/KITS/TRAYS/PACK) ×3 IMPLANT
TRENDGUARD 450 HYBRID PRO PACK (MISCELLANEOUS)
TRENDGUARD 600 HYBRID PROC PK (MISCELLANEOUS)
TROCAR XCEL DIL TIP R 11M (ENDOMECHANICALS) ×3 IMPLANT
TROCAR XCEL NON-BLD 5MMX100MML (ENDOMECHANICALS) ×3 IMPLANT
WARMER LAPAROSCOPE (MISCELLANEOUS) ×3 IMPLANT
WATER STERILE IRR 1000ML POUR (IV SOLUTION) ×3 IMPLANT

## 2016-11-03 NOTE — Anesthesia Procedure Notes (Signed)
Procedure Name: Intubation Date/Time: 11/03/2016 7:32 AM Performed by: Bufford Spikes Pre-anesthesia Checklist: Patient identified, Emergency Drugs available, Suction available and Patient being monitored Patient Re-evaluated:Patient Re-evaluated prior to inductionOxygen Delivery Method: Circle system utilized Preoxygenation: Pre-oxygenation with 100% oxygen Intubation Type: IV induction Ventilation: Mask ventilation without difficulty Laryngoscope Size: Miller and 2 Grade View: Grade II Tube type: Oral Tube size: 7.0 mm Number of attempts: 1 Airway Equipment and Method: Stylet and Oral airway Placement Confirmation: ETT inserted through vocal cords under direct vision,  positive ETCO2 and breath sounds checked- equal and bilateral Secured at: 21 cm Tube secured with: Tape Dental Injury: Teeth and Oropharynx as per pre-operative assessment

## 2016-11-03 NOTE — Anesthesia Preprocedure Evaluation (Addendum)
Anesthesia Evaluation  Patient identified by MRN, date of birth, ID band Patient awake    Reviewed: Allergy & Precautions, H&P , NPO status , Patient's Chart, lab work & pertinent test results, reviewed documented beta blocker date and time   History of Anesthesia Complications (+) PONV and history of anesthetic complications  Airway Mallampati: II  TM Distance: >3 FB Neck ROM: Full    Dental no notable dental hx. (+) Teeth Intact   Pulmonary neg pulmonary ROS,    Pulmonary exam normal        Cardiovascular hypertension, Normal cardiovascular exam     Neuro/Psych  Headaches, Anxiety  Neuromuscular disease negative psych ROS   GI/Hepatic negative GI ROS, Neg liver ROS,   Endo/Other  negative endocrine ROSObesity  Renal/GU negative Renal ROS  negative genitourinary   Musculoskeletal LBP with Sciatica   Abdominal (+) + obese,   Peds  Hematology negative hematology ROS (+) anemia ,   Anesthesia Other Findings   Reproductive/Obstetrics negative OB ROS Symptomatic leiomyoma                              Chemistry      Component Value Date/Time   NA 138 10/20/2016 0930   K 3.9 10/20/2016 0930   CL 108 10/20/2016 0930   CO2 25 10/20/2016 0930   BUN 14 10/20/2016 0930   CREATININE 0.68 10/20/2016 0930      Component Value Date/Time   CALCIUM 9.2 10/20/2016 0930   ALKPHOS 53 02/20/2014 0420   AST 18 02/20/2014 0420   ALT 14 02/20/2014 0420   BILITOT 0.5 02/20/2014 0420     Lab Results  Component Value Date   WBC 9.2 10/20/2016   HGB 9.5 (L) 10/20/2016   HCT 30.1 (L) 10/20/2016   MCV 72.4 (L) 10/20/2016   PLT 269 10/20/2016    Anesthesia Physical Anesthesia Plan  ASA: II  Anesthesia Plan: General   Post-op Pain Management:    Induction: Intravenous  Airway Management Planned: Oral ETT  Additional Equipment:   Intra-op Plan:   Post-operative Plan: Extubation in  OR  Informed Consent: I have reviewed the patients History and Physical, chart, labs and discussed the procedure including the risks, benefits and alternatives for the proposed anesthesia with the patient or authorized representative who has indicated his/her understanding and acceptance.   Dental advisory given  Plan Discussed with: CRNA, Anesthesiologist and Surgeon  Anesthesia Plan Comments:         Anesthesia Quick Evaluation

## 2016-11-03 NOTE — Progress Notes (Signed)
Post-op check, TLH Doing ok, pain ok, some dizziness and nausea, has ambulated, wants to eat Afeb, VSS, adequate UOP Abd- soft, incisions intact Continue routine care, advance diet

## 2016-11-03 NOTE — Anesthesia Postprocedure Evaluation (Signed)
Anesthesia Post Note  Patient: Allison Wade  Procedure(s) Performed: Procedure(s) (LRB): HYSTERECTOMY TOTAL LAPAROSCOPIC (N/A)  Patient location during evaluation: PACU Anesthesia Type: General Level of consciousness: awake and alert and oriented Pain management: pain level controlled Vital Signs Assessment: post-procedure vital signs reviewed and stable Respiratory status: spontaneous breathing, nonlabored ventilation, respiratory function stable and patient connected to nasal cannula oxygen Cardiovascular status: blood pressure returned to baseline and stable Postop Assessment: no signs of nausea or vomiting Anesthetic complications: no     Last Vitals:  Vitals:   11/03/16 0618 11/03/16 0958  BP: (!) 132/91 (!) 118/94  Pulse: 73 79  Resp: 18 16  Temp: 36.5 C 36.4 C    Last Pain:  Vitals:   11/03/16 0618  TempSrc: Oral   Pain Goal:                 Kodee Drury A.

## 2016-11-03 NOTE — Transfer of Care (Signed)
Immediate Anesthesia Transfer of Care Note  Patient: Allison Wade  Procedure(s) Performed: Procedure(s) with comments: HYSTERECTOMY TOTAL LAPAROSCOPIC (N/A) - Bilateral Salpingectomy  Patient Location: PACU  Anesthesia Type:General  Level of Consciousness: awake, alert  and sedated  Airway & Oxygen Therapy: Patient Spontanous Breathing and Patient connected to nasal cannula oxygen  Post-op Assessment: Report given to RN and Post -op Vital signs reviewed and stable  Post vital signs: Reviewed and stable  Last Vitals:  Vitals:   11/03/16 0618  BP: (!) 132/91  Pulse: 73  Resp: 18  Temp: 36.5 C    Last Pain:  Vitals:   11/03/16 0618  TempSrc: Oral         Complications: No apparent anesthesia complications

## 2016-11-03 NOTE — Op Note (Addendum)
Preoperative diagnosis: Symptomatic myomatous uterus Postoperative diagnosis: Same  Procedure: Total laparoscopic hysterectomy, bilateral salpingectomy  Surgeon: Cheri Fowler M.D.  Assistant: Crawford Givens, D.O. Anesthesia: Gen. Endotracheal tube  Findings: She had an essentially normal pelvis, some adhesions RLQ, uterus slightly enlarged and irregular, tubes with evidence of prior BTL, and normal ovaries  Specimens: Uterus and tubes for routine pathology  Estimated blood loss: [id number] cc  Complications: None  Procedure in detail:  The patient was taken to the operating room and placed in the dorsosupine position. General anesthesia was induced. Arms were tucked to her sides and legs were placed in mobile stirrups. Abdomen perineum and vagina were then prepped and draped in usual sterile fashion. A 74mm RUMI with the medium sized metal cup was then applied to the uterus and cervix for uterine manipulation and a Foley catheter was inserted. Infraumbilical skin was infiltrated with quarter percent Marcaine and a 1 cm vertical incision was made. A veress needle was inserted into the peritoneal cavity and placement confirmed by the water drop test an opening pressure of 3 mm of mercury. CO2 was insufflated to a pressure of 13 mm mercury and the veress needle was removed. A 5 mm trocar was then introduced with direct visualization with the laparoscope. A 5 mm port was then also placed on the right side and a 10/11 trocar placed on the left side under direct visualization. Inspection revealed the above-mentioned findings. The distal right fallopian tube was grasped with a grasper from the left side. The Harmonic scalpel Ace was used to take down the right mesosalpinx and the distal tube was removed.  The round ligament, utero-ovarian pedicle and broad ligament were then also taken down. The anterior peritoneum was incised across the anterior portion of the uterus to help release the bladder. Uterine artery artery  was skeletonized and taken down with the harmonic scalpel Ace with adequate division and adequate hemostasis. A similar procedure was then performed on the patient's left side taking down the mesosalpinx and removing the distal tube, then the round ligament, utero-ovarian pedicle, and broad ligament. Anterior peritoneum was incised across the anterior portion the uterus to meet the incision coming from the patient's right side, taking down some adhesions along the way. Uterine artery was skeletonized and taken down with the Harmonic Scalpel with adequate division and adequate hemostasis. I then began to detach the cervix from the vagina. The RUMI cup was pushed superiorly, the Harmonic scalpel was placed on the cup edge and a circumferential incision was made starting anteriorly into the vagina. This released the uterosacral ligaments as well as all attachments to the vagina. At this point all pedicles appeared to be hemostatic and there was no other pathology noted.  The uterus was pulled into the vagina with some difficulty due to it's size.  The pelvis was irrigated and found to be hemostatic.  The vaginal cuff was then closed with the Endostitch device using 0 V-lok suture.  The needle came off the suture about 1/3 of the way across, so a second V-lok suture was used to close the remaining cuff.  The pelvis was again irrigated and found to be hemostatic with good closure of the vaginal cuff.  The lateral ports were removed under direct visualization and all gas was allowed to deflate from the abdomen. The umbilical trocar was then removed. A 0 Vicryl suture was placed as deep as possible on the left side where the 10/11 port was placed.  Skin incisions were closed with  interrupted subcuticular sutures of 4-0 Vicryl followed by Dermabond. The uterus was then removed from the vagina.  The patient tolerated the procedure well. She was taken to the recovery in stable condition. Counts were correct x2, she received  Gentamicin and Clindamycin IV at the beginning of the procedure and had PAS hose on throughout the procedure.

## 2016-11-03 NOTE — Interval H&P Note (Signed)
History and Physical Interval Note:  11/03/2016 7:09 AM  Allison Wade  has presented today for surgery, with the diagnosis of symptomatic myomas  The various methods of treatment have been discussed with the patient and family. After consideration of risks, benefits and other options for treatment, the patient has consented to  Procedure(s) with comments: HYSTERECTOMY TOTAL LAPAROSCOPIC (N/A) - Bilateral Salpingectomy as a surgical intervention .  The patient's history has been reviewed, patient examined, no change in status, stable for surgery.  I have reviewed the patient's chart and labs.  Questions were answered to the patient's satisfaction.     Eileene Kisling D

## 2016-11-04 ENCOUNTER — Encounter (HOSPITAL_COMMUNITY): Payer: Self-pay | Admitting: Obstetrics and Gynecology

## 2016-11-04 DIAGNOSIS — D251 Intramural leiomyoma of uterus: Secondary | ICD-10-CM | POA: Diagnosis not present

## 2016-11-04 LAB — CBC
HEMATOCRIT: 23.7 % — AB (ref 36.0–46.0)
Hemoglobin: 7.5 g/dL — ABNORMAL LOW (ref 12.0–15.0)
MCH: 23.4 pg — ABNORMAL LOW (ref 26.0–34.0)
MCHC: 31.6 g/dL (ref 30.0–36.0)
MCV: 73.8 fL — ABNORMAL LOW (ref 78.0–100.0)
Platelets: 401 10*3/uL — ABNORMAL HIGH (ref 150–400)
RBC: 3.21 MIL/uL — ABNORMAL LOW (ref 3.87–5.11)
RDW: 16.4 % — AB (ref 11.5–15.5)
WBC: 13.5 10*3/uL — AB (ref 4.0–10.5)

## 2016-11-04 MED ORDER — HYDROMORPHONE HCL 2 MG PO TABS: 2.0000 mg | ORAL_TABLET | ORAL | 0 refills | Status: DC | PRN

## 2016-11-04 NOTE — Progress Notes (Signed)
Patient discharged with written instruction. Patient verbalized an understanding. No concerns noted at this time. Toya Smothers, RN

## 2016-11-04 NOTE — Discharge Summary (Signed)
Physician Discharge Summary  Patient ID: Allison Wade MRN: [id number] DOB/AGE: [id number] y.o.  Admit date: 11/03/2016 Discharge date: 11/04/2016  Admission Diagnoses:  Symptomatic myomatous uterus  Discharge Diagnoses: Same Active Problems:   S/P laparoscopic hysterectomy   Discharged Condition: good  Hospital Course: Pt admitted for TLH/bilateral salpingectomy for symptomatic myomas.  Surgery without complications.  Initially with too much pain and nausea to go home night of surgery, but this improved and she was stable for d/c home on POD #1.  Discharge Exam: Blood pressure 125/71, pulse 83, temperature 98.5 F (36.9 C), temperature source Oral, resp. rate 18, height 5' 6.5" (1.689 m), weight 88.2 kg (194 lb 8 oz), last menstrual period 10/28/2016, SpO2 99 %. General appearance: alert  Disposition: 01-Home or Self Care  Discharge Instructions    Call MD for:  difficulty breathing, headache or visual disturbances    Complete by:  As directed    Call MD for:  persistant dizziness or light-headedness    Complete by:  As directed    Call MD for:  persistant nausea and vomiting    Complete by:  As directed    Call MD for:  redness, tenderness, or signs of infection (pain, swelling, redness, odor or green/yellow discharge around incision site)    Complete by:  As directed    Call MD for:  severe uncontrolled pain    Complete by:  As directed    Call MD for:  temperature >100.4    Complete by:  As directed    Diet - low sodium heart healthy    Complete by:  As directed    Increase activity slowly    Complete by:  As directed    Lifting restrictions    Complete by:  As directed    10 lbs   Sexual Activity Restrictions    Complete by:  As directed    Pelvic rest       Medication List    TAKE these medications   b complex vitamins tablet Take 1 tablet by mouth daily.   cetirizine 10 MG tablet Commonly known as:  ZYRTEC Take 10 mg by mouth daily.    HYDROmorphone 2 MG tablet Commonly known as:  DILAUDID Take 1 tablet (2 mg total) by mouth every 3 (three) hours as needed for severe pain.   ibuprofen 800 MG tablet Commonly known as:  ADVIL,MOTRIN Take 800 mg by mouth every 8 (eight) hours as needed for moderate pain.   IRON PO Take 1 tablet by mouth daily as needed (during cycle).   ondansetron 4 MG disintegrating tablet Commonly known as:  ZOFRAN ODT Take 1 tablet (4 mg total) by mouth every 8 (eight) hours as needed for nausea or vomiting.   Vitamin D2 2000 units Tabs Take 1 tablet by mouth daily.      Follow-up Information    Twylia Oka D, MD. Schedule an appointment as soon as possible for a visit in 3 week(s).   Specialty:  Obstetrics and Gynecology Contact information: [address] [place] [address] [place] [address] [phone]           Signed: Clarene Duke 11/04/2016, 8:22 AM

## 2016-11-04 NOTE — Discharge Instructions (Signed)
Routine instructions for hysterectomy °

## 2016-11-04 NOTE — Progress Notes (Signed)
1 Day Post-Op Procedure(s) (LRB): HYSTERECTOMY TOTAL LAPAROSCOPIC (N/A)  Subjective: Patient reports incisional pain, tolerating PO and no problems voiding.    Objective: I have reviewed patient's vital signs, intake and output and labs.  General: alert GI: soft, incisions intact Vaginal Bleeding: none  Assessment: s/p Procedure(s) with comments: HYSTERECTOMY TOTAL LAPAROSCOPIC (N/A) - Bilateral Salpingectomy: stable, progressing well and tolerating diet  Plan: Encourage ambulation Discontinue IV fluids Discharge home  LOS: 0 days    Sevin Langenbach D 11/04/2016, 8:19 AM

## 2017-02-08 ENCOUNTER — Emergency Department (HOSPITAL_COMMUNITY): Payer: 59

## 2017-02-08 ENCOUNTER — Encounter (HOSPITAL_COMMUNITY): Payer: Self-pay | Admitting: Emergency Medicine

## 2017-02-08 ENCOUNTER — Emergency Department (HOSPITAL_COMMUNITY)
Admission: EM | Admit: 2017-02-08 | Discharge: 2017-02-08 | Disposition: A | Discharge status: Home / Self Care | Payer: 59 | Attending: Emergency Medicine | Admitting: Emergency Medicine

## 2017-02-08 DIAGNOSIS — Z79899 Other long term (current) drug therapy: Secondary | ICD-10-CM | POA: Diagnosis not present

## 2017-02-08 DIAGNOSIS — R062 Wheezing: Secondary | ICD-10-CM

## 2017-02-08 DIAGNOSIS — J9801 Acute bronchospasm: Secondary | ICD-10-CM | POA: Diagnosis not present

## 2017-02-08 DIAGNOSIS — I10 Essential (primary) hypertension: Secondary | ICD-10-CM | POA: Diagnosis not present

## 2017-02-08 DIAGNOSIS — R0602 Shortness of breath: Secondary | ICD-10-CM | POA: Diagnosis present

## 2017-02-08 MED ORDER — PREDNISONE 20 MG PO TABS: ORAL_TABLET | ORAL | 0 refills | Status: DC

## 2017-02-08 MED ORDER — IPRATROPIUM BROMIDE 0.02 % IN SOLN
0.5000 mg | Freq: Once | RESPIRATORY_TRACT | Status: AC
  Administered 2017-02-08: 0.5 mg via RESPIRATORY_TRACT
  Filled 2017-02-08: qty 2.5

## 2017-02-08 MED ORDER — ALBUTEROL SULFATE (2.5 MG/3ML) 0.083% IN NEBU
5.0000 mg | INHALATION_SOLUTION | Freq: Once | RESPIRATORY_TRACT | Status: AC
  Administered 2017-02-08: 5 mg via RESPIRATORY_TRACT
  Filled 2017-02-08: qty 6

## 2017-02-08 MED ORDER — ALBUTEROL SULFATE HFA 108 (90 BASE) MCG/ACT IN AERS: 1.0000 | INHALATION_SPRAY | Freq: Four times a day (QID) | RESPIRATORY_TRACT | 0 refills | Status: DC | PRN

## 2017-02-08 MED ORDER — PREDNISONE 20 MG PO TABS
60.0000 mg | ORAL_TABLET | Freq: Once | ORAL | Status: AC
  Administered 2017-02-08: 60 mg via ORAL
  Filled 2017-02-08: qty 3

## 2017-02-08 NOTE — ED Provider Notes (Signed)
[facility] DEPT Provider Note   CSN: [id number] Arrival date & time: 02/08/17  0240     History   Chief Complaint Chief Complaint  Patient presents with  . Shortness of Breath  . Wheezing    HPI Allison Wade is a 41 y.o. female.  The history is provided by the patient.  Shortness of Breath  This is a new problem. The problem occurs frequently.The current episode started more than 2 days ago. The problem has been gradually worsening. Associated symptoms include cough and wheezing. Pertinent negatives include no fever, no hemoptysis, no chest pain, no vomiting and no leg swelling. Associated medical issues comments: bronchitis.  Wheezing   Associated symptoms include cough. Pertinent negatives include no chest pain, no fever, no vomiting and no hemoptysis.   Patient presents with cough/wheezing She has had episodes of cough/wheeze over the past month, and just recently completed steroids She also reports recent "flu illness" which made her cough worse No fever/vomiting No CP Tonight her shortness of breath worsened and she came to the ED  Pt denies h/o CAD/PE  Past Medical History:  Diagnosis Date  . Allergy   . Anemia   . Anxiety   . Headache    sinus induced migraines  . History of blood transfusion   . Hypertension    no medication, patient stated blood pressure was elevated at MD's office after hearing about the death of her best friend that morning  . PONV (postoperative nausea and vomiting)   . Sciatica     Patient Active Problem List   Diagnosis Date Noted  . S/P laparoscopic hysterectomy 11/03/2016    Past Surgical History:  Procedure Laterality Date  . CESAREAN SECTION    . LAPAROSCOPIC HYSTERECTOMY N/A 11/03/2016   Procedure: HYSTERECTOMY TOTAL LAPAROSCOPIC;  Surgeon: Cheri Fowler, MD;  Location: [facility];  Service: Gynecology;  Laterality: N/A;  Bilateral Salpingectomy  . LAPAROSCOPY      OB History    No data available       Home  Medications    Prior to Admission medications   Medication Sig Start Date End Date Taking? Authorizing Provider  b complex vitamins tablet Take 1 tablet by mouth daily.    Historical Provider, MD  cetirizine (ZYRTEC) 10 MG tablet Take 10 mg by mouth daily.    Historical Provider, MD  Ergocalciferol (VITAMIN D2) 2000 units TABS Take 1 tablet by mouth daily.    Historical Provider, MD  HYDROmorphone (DILAUDID) 2 MG tablet Take 1 tablet (2 mg total) by mouth every 3 (three) hours as needed for severe pain. 11/04/16   Cheri Fowler, MD  ibuprofen (ADVIL,MOTRIN) 800 MG tablet Take 800 mg by mouth every 8 (eight) hours as needed for moderate pain.    Historical Provider, MD  IRON PO Take 1 tablet by mouth daily as needed (during cycle).    Historical Provider, MD  ondansetron (ZOFRAN ODT) 4 MG disintegrating tablet Take 1 tablet (4 mg total) by mouth every 8 (eight) hours as needed for nausea or vomiting. 08/17/16   Carrie Mew, MD    Family History History reviewed. No pertinent family history.  Social History Social History  Substance Use Topics  . Smoking status: Never Smoker  . Smokeless tobacco: Never Used  . Alcohol use No     Allergies   Codeine; Doxycycline; Shellfish allergy; Hydrocodone; and Cefzil [cefprozil]   Review of Systems Review of Systems  Constitutional: Negative for fever.  Respiratory: Positive for cough, shortness  of breath and wheezing. Negative for hemoptysis.   Cardiovascular: Negative for chest pain and leg swelling.  Gastrointestinal: Negative for vomiting.  All other systems reviewed and are negative.    Physical Exam Updated Vital Signs BP (!) 158/108 (BP Location: Right Arm)   Pulse (!) 127   Temp 97.8 F (36.6 C) (Oral)   Ht 5\' 7"  (1.702 m)   Wt 87.1 kg   SpO2 100%   BMI 30.07 kg/m   Physical Exam CONSTITUTIONAL: Well developed/well nourished HEAD: Normocephalic/atraumatic EYES: EOMI/PERRL ENMT: Mucous membranes moist, uvula  mildine, no erythema/exudates NECK: supple no meningeal signs, no JVD SPINE/BACK:entire spine nontender CV: S1/S2 noted, no murmurs/rubs/gallops noted LUNGS: tachypnea noted, wheezing bilaterally ABDOMEN: soft, nontender, no rebound or guarding, bowel sounds noted throughout abdomen GU:no cva tenderness NEURO: Pt is awake/alert/appropriate, moves all extremitiesx4.  No facial droop.   EXTREMITIES: pulses normal/equal, full ROM, no LE edema noted SKIN: warm, color normal PSYCH: no abnormalities of mood noted, alert and oriented to situation   ED Treatments / Results  Labs (all labs ordered are listed, but only abnormal results are displayed) Labs Reviewed - No data to display  EKG  EKG Interpretation  Date/Time:  Tuesday February 08 2017 05:25:09 EDT Ventricular Rate:  122 PR Interval:  118 QRS Duration: 80 QT Interval:  318 QTC Calculation: 453 R Axis:   80 Text Interpretation:  Sinus tachycardia Minimal voltage criteria for LVH, may be normal variant Nonspecific T wave abnormality Abnormal ECG No significant change since last tracing Confirmed by Christy Gentles  MD, Nakia Remmers ([id number]) on 02/08/2017 5:32:31 AM       Radiology Dg Chest Port 1 View  Result Date: 02/08/2017 CLINICAL DATA:  Cough and shortness of breath. EXAM: PORTABLE CHEST 1 VIEW COMPARISON:  None. FINDINGS: Cardiomediastinal silhouette is normal. No pleural effusions or focal consolidations. Trachea projects midline and there is no pneumothorax. Soft tissue planes and included osseous structures are non-suspicious. IMPRESSION: Normal chest. Electronically Signed   By: Elon Alas M.D.   On: 02/08/2017 06:02    Procedures Procedures (including critical care time)  Medications Ordered in ED Medications  albuterol (PROVENTIL) (2.5 MG/3ML) 0.083% nebulizer solution 5 mg (not administered)  albuterol (PROVENTIL) (2.5 MG/3ML) 0.083% nebulizer solution 5 mg (5 mg Nebulization Given 02/08/17 0556)  ipratropium (ATROVENT)  nebulizer solution 0.5 mg (0.5 mg Nebulization Given 02/08/17 0556)  predniSONE (DELTASONE) tablet 60 mg (60 mg Oral Given 02/08/17 0555)     Initial Impression / Assessment and Plan / ED Course  I have reviewed the triage vital signs and the nursing notes.  Pertinent  imaging results that were available during my care of the patient were reviewed by me and considered in my medical decision making (see chart for details).     Pt with recent episodes of wheezing that will respond to meds then return This has been on/off for a month She is now improved Her HR is improved and no hypoxia Wheezing present but improved No signs of CHF She has no formal diagnosis of asthma.  Will refer to pulmonology Will give steroid taper   Final Clinical Impressions(s) / ED Diagnoses   Final diagnoses:  Wheezing  Bronchospasm, acute    New Prescriptions New Prescriptions   ALBUTEROL (PROVENTIL HFA;VENTOLIN HFA) 108 (90 BASE) MCG/ACT INHALER    Inhale 1-2 puffs into the lungs every 6 (six) hours as needed for wheezing or shortness of breath.   PREDNISONE (DELTASONE) 20 MG TABLET    3 tabs  po daily x 3 days, then 2 tabs x 2 days, then 1 tab x 2 days, then 0.5 tabs x 2 days     Ripley Fraise, MD 02/08/17 [phone]

## 2017-02-08 NOTE — ED Triage Notes (Signed)
Pt presents for increasing SOB and wheezing throughout the night; recently dx with bronchitis and finished her steriods and inhaler not working; pt speaking in broken sentences

## 2017-02-11 ENCOUNTER — Ambulatory Visit (INDEPENDENT_AMBULATORY_CARE_PROVIDER_SITE_OTHER)
Admission: RE | Admit: 2017-02-11 | Discharge: 2017-02-11 | Disposition: A | Discharge status: Home / Self Care | Payer: 59 | Source: Ambulatory Visit | Attending: Internal Medicine | Admitting: Internal Medicine

## 2017-02-11 ENCOUNTER — Encounter: Payer: Self-pay | Admitting: Internal Medicine

## 2017-02-11 ENCOUNTER — Ambulatory Visit (INDEPENDENT_AMBULATORY_CARE_PROVIDER_SITE_OTHER): Payer: 59 | Admitting: Internal Medicine

## 2017-02-11 VITALS — BP 132/80 | HR 86 | Ht 67.0 in | Wt 199.0 lb

## 2017-02-11 DIAGNOSIS — R0602 Shortness of breath: Secondary | ICD-10-CM | POA: Diagnosis not present

## 2017-02-11 DIAGNOSIS — R0600 Dyspnea, unspecified: Secondary | ICD-10-CM | POA: Insufficient documentation

## 2017-02-11 DIAGNOSIS — J45991 Cough variant asthma: Secondary | ICD-10-CM

## 2017-02-11 LAB — NITRIC OXIDE: NITRIC OXIDE: 27

## 2017-02-11 MED ORDER — PANTOPRAZOLE SODIUM 40 MG PO TBEC: 40.0000 mg | DELAYED_RELEASE_TABLET | Freq: Every day | ORAL | 2 refills | Status: DC

## 2017-02-11 MED ORDER — MEPERIDINE HCL 50 MG PO TABS: 50.0000 mg | ORAL_TABLET | ORAL | 0 refills | Status: DC | PRN

## 2017-02-11 MED ORDER — FAMOTIDINE 20 MG PO TABS: ORAL_TABLET | ORAL | 2 refills | Status: DC

## 2017-02-11 MED ORDER — MONTELUKAST SODIUM 10 MG PO TABS: 10.0000 mg | ORAL_TABLET | Freq: Every day | ORAL | 11 refills | Status: DC

## 2017-02-11 NOTE — Assessment & Plan Note (Signed)
Most likely due to uacs (see separate a/p)

## 2017-02-11 NOTE — Patient Instructions (Addendum)
Pantoprazole (protonix) 40 mg   Take  30-60 min before first meal of the day and Pepcid (famotidine)  20 mg one @  bedtime until return to office - this is the best way to tell whether stomach acid is contributing to your problem.    Stop allegra d and For nasal drainage / throat tickle try take CHLORPHENIRAMINE  4 mg - take one every 4 hours as needed - available over the counter- may cause drowsiness so start with just a bedtime dose or two and see how you tolerate it before trying in daytime    Add singulair 10 mg each pm until return   Take delsym two tsp every 12 hours and supplement if needed with demerol  50 mg up to 1-2 every 4 hours to suppress the urge to cough. Swallowing water or using ice chips/non mint and menthol containing candies (such as lifesavers or sugarless jolly ranchers) are also effective.  You should rest your voice and avoid activities that you know make you cough.  Once you have eliminated the cough for 3 straight days try reducing the demerol first,  then the delsym as tolerated.    GERD (REFLUX)  is an extremely common cause of respiratory symptoms just like yours , many times with no obvious heartburn at all.    It can be treated with medication, but also with lifestyle changes including elevation of the head of your bed (ideally with 6 inch  bed blocks),  Smoking cessation, avoidance of late meals, excessive alcohol, and avoid fatty foods, chocolate, peppermint, colas, red wine, and acidic juices such as orange juice.  NO MINT OR MENTHOL PRODUCTS SO NO COUGH DROPS   USE SUGARLESS CANDY INSTEAD (Jolley ranchers or Stover's or Life Savers) or even ice chips will also do - the key is to swallow to prevent all throat clearing. NO OIL BASED VITAMINS - use powdered substitutes.    Please see patient coordinator before you leave today  to schedule sinus CT asap  Please schedule a follow up office visit in 2 weeks, sooner if needed  with all medications /inhalers/  solutions in hand so we can verify exactly what you are taking. This includes all medications from all doctors and over the counters

## 2017-02-11 NOTE — Assessment & Plan Note (Addendum)
Spirometry 02/11/2017  Completely nl - FENO 02/11/2017  =  27 - Trial of singulair 02/11/2017 >>>   - Max gerd rx 02/11/2017 >>>  - Sinus CT 02/11/2017  Ordered   The most common causes of chronic cough in immunocompetent adults include the following: upper airway cough syndrome (UACS), previously referred to as postnasal drip syndrome (PNDS), which is caused by variety of rhinosinus conditions; (2) asthma; (3) GERD; (4) chronic bronchitis from cigarette smoking or other inhaled environmental irritants; (5) nonasthmatic eosinophilic bronchitis; and (6) bronchiectasis.   These conditions, singly or in combination, have accounted for up to 94% of the causes of chronic cough in prospective studies.   Other conditions have constituted no >6% of the causes in prospective studies These have included bronchogenic carcinoma, chronic interstitial pneumonia, sarcoidosis, left ventricular failure, ACEI-induced cough, and aspiration from a condition associated with pharyngeal dysfunction.    Chronic cough is often simultaneously caused by more than one condition. A single cause has been found from 38 to 82% of the time, multiple causes from 18 to 62%. Multiply caused cough has been the result of three diseases up to 42% of the time.       This is most likely combination of cough variant asthma and sinusitis vs gerd induced Upper airway cough syndrome (previously labeled PNDS) , is  so named because it's frequently impossible to sort out how much is  CR/sinusitis with freq throat clearing (which can be related to primary GERD)   vs  causing  secondary (" extra esophageal")  GERD from wide swings in gastric pressure that occur with throat clearing, often  promoting self use of mint and menthol lozenges that reduce the lower esophageal sphincter tone and exacerbate the problem further in a cyclical fashion.   These are the same pts (now being labeled as having "irritable larynx syndrome" by some cough centers) who not  infrequently have a history of having failed to tolerate ace inhibitors,  dry powder inhalers or biphosphonates or report having atypical/extraesophageal reflux symptoms that don't respond to standard doses of PPI  and are easily confused as having aecopd or asthma flares by even experienced allergists/ pulmonologists (myself included).   Of the three most common causes of  Sub-acute or recurrent or chronic cough, only one (GERD)  can actually contribute to/ trigger  the other two (asthma and post nasal drip syndrome)  and perpetuate the cylce of cough.  While not intuitively obvious, many patients with chronic low grade reflux do not cough until there is a primary insult that disturbs the protective epithelial barrier and exposes sensitive nerve endings.   This is typically viral but can be direct physical injury such as with an endotracheal tube.   The point is that once this occurs, it is difficult to eliminate the cycle  using anything but a maximally effective acid suppression regimen at least in the short run, accompanied by an appropriate diet to address non acid GERD and control / eliminate the cough itself for at least 3 days.   rec demerol/max gerd rx and trial of singualair while awaiting sinus ct and f/u in 2 weeks when prednisone has completely worn off   Total time devoted to counseling  > 50 % of initial 60 min office visit:  review case with pt/ discussion of options/alternatives/ personally creating written customized instructions  in presence of pt  then going over those specific  Instructions directly with the pt including how to use all of the  meds but in particular covering each new medication in detail and the difference between the maintenance= "automatic" meds and the prns using an action plan format for the latter (If this problem/symptom => do that organization reading Left to right).  Please see AVS from this visit for a full list of these instructions which I personally wrote for  this pt and  are unique to this visit.

## 2017-02-11 NOTE — Progress Notes (Signed)
Subjective:     Patient ID: Allison Wade, female   DOB: Aug 17, 1976, 41 y.o.   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 Pulmonary Consult    Referred by Dr. Christy Gentles. Pt c/o cough, SOB and wheezing over the month. She occ coughs up light yellow sputum.  She states her SOB is "kind of random"- noticed more with colder weather.   finishing prednisone now but still having severe coughing fits day and night, changes in temp/ perfumes. Mostly sob during severe coughing fits  No obvious day to day or daytime variability or assoc excess/ purulent sputum or mucus plugs or hemoptysis or cp or chest tightness, subjective wheeze or overt sinus or hb symptoms. No unusual exp hx or h/o childhood pna/ asthma or knowledge of premature birth.   Also denies any obvious fluctuation of symptoms with weather or environmental changes or other aggravating or alleviating factors except as outlined above   Current Medications, Allergies, Complete Past Medical History, Past Surgical History, Family History, and Social History were reviewed in [employer] record.  ROS  The following are not active complaints unless bolded sore throat, dysphagia, dental problems, itching, sneezing,  nasal congestion or excess/ purulent secretions, ear ache,   fever, chills, sweats, unintended wt  loss, classically pleuritic or exertional cp,  orthopnea pnd or leg swelling, presyncope, palpitations, abdominal pain, anorexia, nausea, vomiting, diarrhea  or change in bowel or bladder habits, change in stools or urine, dysuria,hematuria,  rash, arthralgias, visual complaints, headache, numbness, weakness or ataxia or problems with walking or coordination,  change in mood/affect or memory.         Review of Systems     Objective:   Physical Exam    amb talkative bf with occ dry coughing fits  ok p 4 pffs of albuterol around 5 h prior to OV     Wt Readings from Last 3 Encounters:  02/11/17 199 lb (90.3 kg)  02/08/17 192 lb (87.1 kg)  11/02/16 194 lb 8 oz (88.2 kg)    Vital signs reviewed - - Note on arrival 02 sats  100% on RA     HEENT: nl dentition, and oropharynx. Nl external ear canals without cough reflex - moderate bilateral non-specific turbinate edema  ? L polyp   NECK :  without JVD/Nodes/TM/ nl carotid upstrokes bilaterally   LUNGS: no acc muscle use,  Nl contour chest which is clear to A and P bilaterally without cough on insp or exp maneuvers   CV:  RRR  no s3 or murmur or increase in P2, and no edema   ABD:  soft and nontender with nl inspiratory excursion in the supine position. No bruits or organomegaly appreciated, bowel sounds nl  MS:  Nl gait/ ext warm without deformities,  calf tenderness, cyanosis or clubbing No obvious joint restrictions   SKIN: warm and dry without lesions    NEURO:  alert, approp, nl sensorium with  no motor or cerebellar deficits apparent.      I personally reviewed images and agree with radiology impression as follows:  CXR:   02/08/17 Cardiomediastinal silhouette is normal. No pleural effusions or focal consolidations. Trachea projects midline and there is no pneumothorax. Soft tissue planes and included osseous structures are non-suspicious.   Assessment:

## 2017-02-14 NOTE — Progress Notes (Signed)
Spoke with pt and notified of results per Dr. Wert. Pt verbalized understanding and denied any questions. 

## 2017-02-24 ENCOUNTER — Encounter: Payer: Self-pay | Admitting: Internal Medicine

## 2017-02-24 ENCOUNTER — Ambulatory Visit (INDEPENDENT_AMBULATORY_CARE_PROVIDER_SITE_OTHER): Payer: 59 | Admitting: Internal Medicine

## 2017-02-24 VITALS — BP 148/80 | HR 94 | Ht 67.0 in | Wt 193.8 lb

## 2017-02-24 DIAGNOSIS — J45991 Cough variant asthma: Secondary | ICD-10-CM | POA: Diagnosis not present

## 2017-02-24 LAB — NITRIC OXIDE: Nitric Oxide: 81

## 2017-02-24 MED ORDER — BUDESONIDE-FORMOTEROL FUMARATE 80-4.5 MCG/ACT IN AERO: 2.0000 | INHALATION_SPRAY | Freq: Two times a day (BID) | RESPIRATORY_TRACT | 0 refills | Status: DC

## 2017-02-24 MED ORDER — BUDESONIDE-FORMOTEROL FUMARATE 80-4.5 MCG/ACT IN AERO
2.0000 | INHALATION_SPRAY | Freq: Two times a day (BID) | RESPIRATORY_TRACT | 11 refills | Status: DC
Start: 2017-02-24 — End: 2017-10-28

## 2017-02-24 NOTE — Patient Instructions (Addendum)
No change in previous recommendations   Plan A = Automatic = symbicort 80 Take 2 puffs first thing in am and then another 2 puffs about 12 hours later.     Work on inhaler technique:  relax and gently blow all the way out then take a nice smooth deep breath back in, triggering the inhaler at same time you start breathing in.  Hold for up to 5 seconds if you can. Blow out thru nose. Rinse and gargle with water when done   Plan B = Backup Only use your albuterol as a rescue medication to be used if you can't catch your breath by resting or doing a relaxed purse lip breathing pattern.  - The less you use it, the better it will work when you need it. - Ok to use the inhaler up to 2 puffs  every 4 hours if you must but call for appointment if use goes up over your usual need - Don't leave home without it !!  (think of it like the spare tire for your car)    Please schedule a follow up office visit in 4 weeks, sooner if needed  with all medications /inhalers/ solutions in hand so we can verify exactly what you are taking. This includes all medications from all doctors and over the counters

## 2017-02-24 NOTE — Progress Notes (Signed)
Subjective:     Patient ID: Allison Wade, female   DOB: [birth year],  .   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 trees bronchitis and had allergy testing neg / ent eval said needed surgery but never had it Dr Candi Leash then suddenly in Jan 2018 L sided nasal congestion then cough > rx zpak  Seemed better then dx as Flu [facility] Feb 2017 with cough that never resolved so returned to PCP but not better so rx ventolin no better then to ER 02/08/17 > better p prednisone p 3 rounds so referred to pulmonary clinic 02/11/2017 by Dr Christy Gentles [facility]      History of Present Illness  02/11/2017 1st [facility] office visit/ Annett Boxwell   Chief Complaint  Patient presents with  . Pulmonary Consult    Referred by Dr. Christy Gentles. Pt c/o cough, SOB and wheezing over the month. She occ coughs up light yellow sputum.  She states her SOB is "kind of random"- noticed more with colder weather.   finishing prednisone now but still having severe coughing fits day and night, changes in temp/ perfumes. Mostly sob during severe coughing fits rec Pantoprazole (protonix) 40 mg   Take  30-60 min before first meal of the day and Pepcid (famotidine)  20 mg one @  bedtime until return to office - this is the best way to tell whether stomach acid is contributing to your problem.   Stop allegra d and For nasal drainage / throat tickle try take CHLORPHENIRAMINE  4 mg - take one every 4 hours as needed - available over the counter- may cause drowsiness so start with just a bedtime dose or two and see how you tolerate it before trying in daytime   Add singulair 10 mg each pm until return  Take delsym two tsp every 12 hours and supplement if needed with demerol  50 mg up to 1-2 every 4 hours to suppress the urge to cough. Swallowing water or using ice  chips/non mint and menthol containing candies (such as lifesavers or sugarless jolly ranchers) are also effective.  You should rest your voice and avoid activities that you know make you cough. Once you have eliminated the cough for 3 straight days try reducing the demerol first,  then the delsym as tolerated.   GERD  Diet/   schedule sinus CT asap > neg  Please schedule a follow up office visit in 2 weeks, sooner if needed  with all medications /inhalers/ solutions in hand so we can verify exactly what you are taking. This includes all medications from all doctors and over the counters    02/24/2017 acute extended ov/Timber Marshman re: recurrent cough since Jan 2018  Chief Complaint  Patient presents with  . Follow-up    Breathing has improved. She states she is not coughing as much but "it's trying to come back".  Cough is occ prod with clear to yellowish green sputum. She states had episode of SOB a few days ago, and used ventolin x 2 with no relief, so she started taking primatene tabs otc.   stopped the demerol first then Prednisone was completed on 02/18/17  And same day started back with tickle while on gerd rx and h1 once daily and continue singulair but tickle continued daily and  one night prior to OV  At bible study cough to vomit Cough worse when head hits pillow and worse p shower but no  longer green Perceived need for saba since 02/20/17 but now stops working p 30 min    No obvious day to day or daytime variability or assoc ongoing  excess/ purulent sputum or mucus plugs or hemoptysis or cp or chest tightness, subjective wheeze or overt sinus or hb symptoms. No unusual exp hx or h/o childhood pna/ asthma or knowledge of premature birth.  Sleeping ok without nocturnal  or early am exacerbation  of respiratory  c/o's or need for noct saba. Also denies any obvious fluctuation of symptoms with weather or environmental changes or other aggravating or alleviating factors except as outlined above    Current Medications, Allergies, Complete Past Medical History, Past Surgical History, Family History, and Social History were reviewed in [employer] record.  ROS  The following are not active complaints unless bolded sore throat, dysphagia, dental problems, itching, sneezing,  nasal congestion or excess/ purulent secretions, ear ache,   fever, chills, sweats, unintended wt loss, classically pleuritic or exertional cp,  orthopnea pnd or leg swelling, presyncope, palpitations, abdominal pain, anorexia, nausea, vomiting, diarrhea  or change in bowel or bladder habits, change in stools or urine, dysuria,hematuria,  rash, arthralgias, visual complaints, headache, numbness, weakness or ataxia or problems with walking or coordination,  change in mood/affect or memory.               Objective:   Physical Exam    amb talkative bf  No meds x 12 hours / ? Belle affect     02/24/2017      193   02/11/17 199 lb (90.3 kg)  02/08/17 192 lb (87.1 kg)  11/02/16 194 lb 8 oz (88.2 kg)    Vital signs reviewed -  - Note on arrival 02 sats  100% on RA- note bp 148/80      HEENT: nl dentition, and oropharynx. Nl external ear canals without cough reflex - mild  bilateral non-specific turbinate edema     NECK :  without JVD/Nodes/TM/ nl carotid upstrokes bilaterally   LUNGS: no acc muscle use,  Nl contour chest which is clear to A and P bilaterally without cough on insp or exp maneuvers   CV:  RRR  no s3 or murmur or increase in P2, and no edema   ABD:  soft and nontender with nl inspiratory excursion in the supine position. No bruits or organomegaly appreciated, bowel sounds nl  MS:  Nl gait/ ext warm without deformities, calf tenderness, cyanosis or clubbing No obvious joint restrictions   SKIN: warm and dry without lesions    NEURO:  alert, approp, nl sensorium with  no motor or cerebellar deficits apparent.      I personally reviewed images and agree with radiology  impression as follows:  CXR:   02/08/17 Cardiomediastinal silhouette is normal. No pleural effusions or focal consolidations. Trachea projects midline and there is no pneumothorax. Soft tissue planes and included osseous structures are non-suspicious.   Assessment:

## 2017-02-28 NOTE — Assessment & Plan Note (Addendum)
Spirometry 02/11/2017  Completely nl - FENO 02/11/2017  =  27 on pred - Trial of singulair 02/11/2017 >>>   - Max gerd rx 02/11/2017 >>>  - Sinus CT 02/11/2017   wnl    - FENO 02/24/2017  =   81 off pred since 02/18/17  - Spirometry 02/24/2017  wnl during flare with last saba x 12 h prior  - FENO 02/24/2017  =     81 off ICS / on singulair  02/24/2017  After extensive coaching HFA effectiveness =    75% start symbicort 80 2bid  FENO increase is quite striking and occurred off pred/ on singulair which argues strongly for trial of ics so rec symb 80 2bid  And no more demerol for the cough should be needed at this point  I had an extended discussion with the patient reviewing all relevant studies completed to date and  lasting 15 to 20 minutes of a 25 minute visit    Each maintenance medication was reviewed in detail including most importantly the difference between maintenance and prns and under what circumstances the prns are to be triggered using an action plan format that is not reflected in the computer generated alphabetically organized AVS.    Please see AVS for specific instructions unique to this visit that I personally wrote and verbalized to the the pt in detail and then reviewed with pt  by my nurse highlighting any  changes in therapy recommended at today's visit to their plan of care.    Advised to return with all meds in hand using a trust but verify approach to confirm accurate Medication  Reconciliation The principal here is that until we are certain that the  patients are doing what we've asked, it makes no sense to ask them to do more. Marland Kitchen

## 2017-03-14 ENCOUNTER — Other Ambulatory Visit: Payer: Self-pay | Admitting: Internal Medicine

## 2017-03-14 DIAGNOSIS — J45991 Cough variant asthma: Secondary | ICD-10-CM

## 2017-03-14 MED ORDER — FAMOTIDINE 20 MG PO TABS: ORAL_TABLET | ORAL | 0 refills | Status: DC

## 2017-03-14 MED ORDER — PANTOPRAZOLE SODIUM 40 MG PO TBEC: 40.0000 mg | DELAYED_RELEASE_TABLET | Freq: Every day | ORAL | 0 refills | Status: DC

## 2017-03-25 ENCOUNTER — Encounter: Payer: Self-pay | Admitting: Adult Health

## 2017-03-25 ENCOUNTER — Ambulatory Visit (INDEPENDENT_AMBULATORY_CARE_PROVIDER_SITE_OTHER): Payer: 59 | Admitting: Adult Health

## 2017-03-25 ENCOUNTER — Other Ambulatory Visit (INDEPENDENT_AMBULATORY_CARE_PROVIDER_SITE_OTHER): Payer: 59

## 2017-03-25 VITALS — BP 132/82 | HR 87 | Ht 67.0 in | Wt 199.4 lb

## 2017-03-25 DIAGNOSIS — J45991 Cough variant asthma: Secondary | ICD-10-CM | POA: Diagnosis not present

## 2017-03-25 DIAGNOSIS — J310 Chronic rhinitis: Secondary | ICD-10-CM

## 2017-03-25 LAB — CBC WITH DIFFERENTIAL/PLATELET
Basophils Absolute: 0.1 10*3/uL (ref 0.0–0.1)
Basophils Relative: 0.8 % (ref 0.0–3.0)
EOS PCT: 0.9 % (ref 0.0–5.0)
Eosinophils Absolute: 0.1 10*3/uL (ref 0.0–0.7)
HEMATOCRIT: 33.8 % — AB (ref 36.0–46.0)
HEMOGLOBIN: 10.8 g/dL — AB (ref 12.0–15.0)
LYMPHS PCT: 28.4 % (ref 12.0–46.0)
Lymphs Abs: 2.7 10*3/uL (ref 0.7–4.0)
MCHC: 31.8 g/dL (ref 30.0–36.0)
MCV: 70.3 fl — AB (ref 78.0–100.0)
MONOS PCT: 6.6 % (ref 3.0–12.0)
Monocytes Absolute: 0.6 10*3/uL (ref 0.1–1.0)
NEUTROS ABS: 6 10*3/uL (ref 1.4–7.7)
Neutrophils Relative %: 63.3 % (ref 43.0–77.0)
PLATELETS: 323 10*3/uL (ref 150.0–400.0)
RBC: 4.81 Mil/uL (ref 3.87–5.11)
RDW: 19 % — AB (ref 11.5–15.5)
WBC: 9.5 10*3/uL (ref 4.0–10.5)

## 2017-03-25 LAB — NITRIC OXIDE: Nitric Oxide: 8

## 2017-03-25 NOTE — Assessment & Plan Note (Addendum)
Much Improved with current regimen , -FENO is normal now  Continues to have post nasal drip -add allegra/chlortrimeton  Patient's medications were reviewed today and patient education was given. Computerized medication calendar was adjusted/completed    Plan  Patient Instructions  Continue on current regimen.  Begin Allegra 180mg  daily in am .  Restart Chlortrimeton 4mg  At bedtime  .  Follow med calendar closely and bring to each visit.  Follow up with Dr. Melvyn Novas  In 2 months and As needed

## 2017-03-25 NOTE — Assessment & Plan Note (Signed)
Check IgE /RAST , cbc w/ diff  Add allegra/chlortrimeton  Plan  Patient Instructions  Continue on current regimen.  Begin Allegra 180mg  daily in am .  Restart Chlortrimeton 4mg  At bedtime  .  Follow med calendar closely and bring to each visit.  Follow up with Dr. Melvyn Novas  In 2 months and As needed

## 2017-03-25 NOTE — Addendum Note (Signed)
Addended by: Parke Poisson E on: 03/25/2017 10:16 AM   Modules accepted: Orders

## 2017-03-25 NOTE — Progress Notes (Signed)
Chart and office note reviewed in detail  > agree with a/p as outlined    

## 2017-03-25 NOTE — Progress Notes (Signed)
@Patient  ID: Allison Wade, female    DOB: October 28, 1976, 41 y.o.   MRN: [id number]  Chief Complaint  Patient presents with  . Follow-up    Dyspnea     Referring provider: Benito Mccreedy, MD  HPI: 41 year old female never smoker seen for pulmonary consult for cough. 02/11/2017  TEST  CT sinus 02/12/17 >neg  CXR 02/08/17 neg  Spirometry 02/24/2017 was normal with an FEV1 at 80%, ratio 81%, FVC 81% FENO 81   03/25/2017 Follow up : Cough Variant Asthma  Patient returns for a one-month follow-up. Patient was seen for a pulmonary consult for cough. 02/11/2017. Patient was found to have some cough variant asthma. She was started on a reflux regimen with Protonix and Pepcid for upper airway irritation from reflux. She was also started on Singulair and Chlor-Trimeton.Marland Kitchen She was to use Delsym and Demeraol for cough control. Sinus CT was negative for acute infection.  Patient had an held nitric oxide testing that was elevated at 81. Patient was treated with a prednisone taper and started on Symbicort. Patient returns today feeling better with less cough and wheezing . Is able to have a conversation without coughing now and now can take in deep breath.  Feels trees/pollen aggravate her cough . FENO today is much better at 8.     Allergies  Allergen Reactions  . Codeine Anaphylaxis and Itching  . Doxycycline Anaphylaxis and Itching  . Shellfish Allergy Shortness Of Breath and Other (See Comments)    Lips tingle  . Etodolac Other (See Comments)    Rapid heart rate  . Hydrocodone Itching and Other (See Comments)    Makes pt feel spacey  . Cefzil [Cefprozil] Rash     There is no immunization history on file for this patient.  Past Medical History:  Diagnosis Date  . Allergy   . Anemia   . Anxiety   . Headache    sinus induced migraines  . History of blood transfusion   . Hypertension    no medication, patient stated blood pressure was elevated at MD's office after hearing about  the death of her best friend that morning  . PONV (postoperative nausea and vomiting)   . Sciatica     Tobacco History: History  Smoking Status  . Never Smoker  Smokeless Tobacco  . Never Used   Counseling given: Not Answered   Outpatient Encounter Prescriptions as of 03/25/2017  Medication Sig  . albuterol (PROVENTIL HFA;VENTOLIN HFA) 108 (90 Base) MCG/ACT inhaler Inhale 1-2 puffs into the lungs every 6 (six) hours as needed for wheezing or shortness of breath.  Marland Kitchen b complex vitamins tablet Take 1 tablet by mouth daily.  . budesonide-formoterol (SYMBICORT) 80-4.5 MCG/ACT inhaler Inhale 2 puffs into the lungs 2 (two) times daily.  . cholecalciferol (VITAMIN D) 1000 units tablet Take 1,000 Units by mouth daily.  . famotidine (PEPCID) 20 MG tablet One at bedtime  . meperidine (DEMEROL) 50 MG tablet Take 1 tablet (50 mg total) by mouth every 4 (four) hours as needed for severe pain.  . montelukast (SINGULAIR) 10 MG tablet Take 1 tablet (10 mg total) by mouth at bedtime.  . pantoprazole (PROTONIX) 40 MG tablet Take 1 tablet (40 mg total) by mouth daily. Take 30-60 min before first meal of the day   No facility-administered encounter medications on file as of 03/25/2017.      Review of Systems  Constitutional:   No  weight loss, night sweats,  Fevers, chills, fatigue, or  lassitude.  HEENT:   No headaches,  Difficulty swallowing,  Tooth/dental problems, or  Sore throat,                No sneezing, itching, ear ache,  +nasal congestion, post nasal drip,   CV:  No chest pain,  Orthopnea, PND, swelling in lower extremities, anasarca, dizziness, palpitations, syncope.   GI  No heartburn, indigestion, abdominal pain, nausea, vomiting, diarrhea, change in bowel habits, loss of appetite, bloody stools.   Resp:   No chest wall deformity  Skin: no rash or lesions.  GU: no dysuria, change in color of urine, no urgency or frequency.  No flank pain, no hematuria   MS:  No joint pain or  swelling.  No decreased range of motion.  No back pain.    Physical Exam  BP 132/82 (BP Location: Left Arm, Cuff Size: Normal)   Pulse 87   Ht 5\' 7"  (1.702 m)   Wt 199 lb 6.4 oz (90.4 kg)   SpO2 100%   BMI 31.23 kg/m   GEN: A/Ox3; pleasant , NAD    HEENT:  Omer/AT,  EACs-clear, TMs-wnl, NOSE-clear drainage , THROAT-clear, no lesions, no postnasal drip or exudate noted.   NECK:  Supple w/ fair ROM; no JVD; normal carotid impulses w/o bruits; no thyromegaly or nodules palpated; no lymphadenopathy.    RESP  Clear  P & A; w/o, wheezes/ rales/ or rhonchi. no accessory muscle use, no dullness to percussion  CARD:  RRR, no m/r/g, no peripheral edema, pulses intact, no cyanosis or clubbing.  GI:   Soft & nt; nml bowel sounds; no organomegaly or masses detected.   Musco: Warm bil, no deformities or joint swelling noted.   Neuro: alert, no focal deficits noted.    Skin: Warm, no lesions or rashes  .  BNP No results found for: BNP  ProBNP No results found for: PROBNP  Imaging: No results found.   Assessment & Plan:   No problem-specific Assessment & Plan notes found for this encounter.     Rexene Edison, NP 03/25/2017

## 2017-03-25 NOTE — Patient Instructions (Addendum)
Continue on current regimen.  Begin Allegra 180mg  daily in am .  Restart Chlortrimeton 4mg  At bedtime  .  Follow med calendar closely and bring to each visit.  Follow up with Dr. Melvyn Novas  In 2 months and As needed

## 2017-03-28 LAB — RESPIRATORY ALLERGY PROFILE REGION II ~~LOC~~
Allergen, C. Herbarum, M2: <0.1 kU/L
Allergen, Cottonwood, t14: <0.1 kU/L
Allergen, D pternoyssinus,d7: <0.1 kU/L
Allergen, Mulberry, t76: <0.1 kU/L
Bermuda Grass: <0.1 kU/L
CAT DANDER: 1.61 kU/L — AB
Cockroach: 0.49 kU/L — ABNORMAL HIGH
D. farinae: <0.1 kU/L
Dog Dander: 0.11 kU/L — ABNORMAL HIGH
Elm IgE: <0.1 kU/L
IGE (IMMUNOGLOBULIN E), SERUM: 86 kU/L (ref <115)
Johnson Grass: <0.1 kU/L
Pecan/Hickory Tree IgE: <0.1 kU/L
Timothy Grass: <0.1 kU/L

## 2017-03-30 ENCOUNTER — Telehealth: Payer: Self-pay | Admitting: Internal Medicine

## 2017-03-30 DIAGNOSIS — R053 Chronic cough: Secondary | ICD-10-CM

## 2017-03-30 DIAGNOSIS — R05 Cough: Secondary | ICD-10-CM

## 2017-03-30 NOTE — Telephone Encounter (Signed)
ATC, NA and VM not set up yet

## 2017-03-30 NOTE — Telephone Encounter (Signed)
Pt c/o sinus congestion, PND worse on R side, sore throat, producing clear mucus. s/s present X1 day.  Denies chest congestion, chest pain, fever, nausea, vomiting.  Pt has used [employer] and salt water rinses to help with recs.  Requesting further recs.    [facility]   MW please advise.  Thanks!  02/11/17 AVS: Patient Instructions   Pantoprazole (protonix) 40 mg   Take  30-60 min before first meal of the day and Pepcid (famotidine)  20 mg one @  bedtime until return to office - this is the best way to tell whether stomach acid is contributing to your problem.     Stop allegra d and For nasal drainage / throat tickle try take CHLORPHENIRAMINE  4 mg - take one every 4 hours as needed - available over the counter- may cause drowsiness so start with just a bedtime dose or two and see how you tolerate it before trying in daytime     Add singulair 10 mg each pm until return    Take delsym two tsp every 12 hours and supplement if needed with demerol  50 mg up to 1-2 every 4 hours to suppress the urge to cough. Swallowing water or using ice chips/non mint and menthol containing candies (such as lifesavers or sugarless jolly ranchers) are also effective.  You should rest your voice and avoid activities that you know make you cough.   Once you have eliminated the cough for 3 straight days try reducing the demerol first,  then the delsym as tolerated.     GERD (REFLUX)  is an extremely common cause of respiratory symptoms just like yours , many times with no obvious heartburn at all.     It can be treated with medication, but also with lifestyle changes including elevation of the head of your bed (ideally with 6 inch  bed blocks),  Smoking cessation, avoidance of late meals, excessive alcohol, and avoid fatty foods, chocolate, peppermint, colas, red wine, and acidic juices such as orange juice.  NO MINT OR MENTHOL PRODUCTS SO NO COUGH DROPS   USE SUGARLESS CANDY INSTEAD (Jolley ranchers or  Stover's or Life Savers) or even ice chips will also do - the key is to swallow to prevent all throat clearing. NO OIL BASED VITAMINS - use powdered substitutes.     Please see patient coordinator before you leave today  to schedule sinus CT asap   Please schedule a follow up office visit in 2 weeks, sooner if needed  with all medications /inhalers/ solutions in hand so we can verify exactly what you are taking. This includes all medications from all doctors and over the counters

## 2017-03-30 NOTE — Telephone Encounter (Signed)
Try stop allegra and chlorpheniramine and just use advil cold and sinus otc  Next step is ENT eval > refer to [facility]

## 2017-03-30 NOTE — Telephone Encounter (Signed)
Pt called back, aware of recs.  Referral placed.  Nothing further needed.

## 2017-03-31 ENCOUNTER — Telehealth: Payer: Self-pay | Admitting: Internal Medicine

## 2017-03-31 NOTE — Telephone Encounter (Signed)
Notes recorded by Melvenia Needles, NP on 03/29/2017 at 1:57 PM EDT CBC is better, Hbg is much better. Allergy test positive for cat and dog, cockroach dust.  IgE was ok at 86  Cont w/ ov recs Please contact office for sooner follow up if symptoms do not improve or worsen or seek emergency care  ---------------  Spoke with pt, aware of results/recs.  Nothing further needed.

## 2017-04-14 ENCOUNTER — Other Ambulatory Visit: Payer: Self-pay | Admitting: Internal Medicine

## 2017-04-14 ENCOUNTER — Telehealth: Payer: Self-pay | Admitting: Internal Medicine

## 2017-04-14 NOTE — Telephone Encounter (Signed)
Pt has been scheduled for acute visit with MW for 04/15/17 @ 9:00. Pt is aware and voiced her understanding. Nothing further needed.

## 2017-04-14 NOTE — Telephone Encounter (Signed)
ATC pt-unable to leave vm, due to mailbox not being setup. Wcb.

## 2017-04-14 NOTE — Telephone Encounter (Signed)
Nothing to offer over the phone, see if she can come in 1st thing in am or see Tammy NP but either way bring all meds

## 2017-04-14 NOTE — Telephone Encounter (Signed)
Double book first thing in am 5/18

## 2017-04-14 NOTE — Telephone Encounter (Signed)
There is no availability tomorrow with MW or NP.  MW please advise if you would like to double book or okay to schedule with another provider?

## 2017-04-14 NOTE — Telephone Encounter (Signed)
Patient returned call and advised of needing an appt.  There is nothing on the schedule for TP, SG or Dr. Melvyn Novas. Please call patient to schedule appointment for tomorrow.  CB is [phone].

## 2017-04-14 NOTE — Telephone Encounter (Signed)
Spoke with pt, who reports of dry cough at times prod with light yellow mucus, sneezing, chest tightness & mild wheezing Pt states she is trying not to cough per MW instructions. Pt states she feels this is due to weather change. Pt used ventolin bid yesterday. Pt states she use humidifier, vicks rub, symbicort, pepcid & chlorpheniramine last night with mild improvement. I have offered pt an apt for today. Pt states she was planning on going into work at 25 today.  MW please advise. Thanks.    02/11/17 Patient Instructions    Pantoprazole (protonix) 40 mg   Take  30-60 min before first meal of the day and Pepcid (famotidine)  20 mg one @  bedtime until return to office - this is the best way to tell whether stomach acid is contributing to your problem.    Stop allegra d and For nasal drainage / throat tickle try take CHLORPHENIRAMINE  4 mg - take one every 4 hours as needed - available over the counter- may cause drowsiness so start with just a bedtime dose or two and see how you tolerate it before trying in daytime    Add singulair 10 mg each pm until return   Take delsym two tsp every 12 hours and supplement if needed with demerol  50 mg up to 1-2 every 4 hours to suppress the urge to cough. Swallowing water or using ice chips/non mint and menthol containing candies (such as lifesavers or sugarless jolly ranchers) are also effective.  You should rest your voice and avoid activities that you know make you cough.  Once you have eliminated the cough for 3 straight days try reducing the demerol first,  then the delsym as tolerated.    GERD (REFLUX)  is an extremely common cause of respiratory symptoms just like yours , many times with no obvious heartburn at all.    It can be treated with medication, but also with lifestyle changes including elevation of the head of your bed (ideally with 6 inch  bed blocks),  Smoking cessation, avoidance of late meals, excessive alcohol, and avoid fatty  foods, chocolate, peppermint, colas, red wine, and acidic juices such as orange juice.  NO MINT OR MENTHOL PRODUCTS SO NO COUGH DROPS   USE SUGARLESS CANDY INSTEAD (Jolley ranchers or Stover's or Life Savers) or even ice chips will also do - the key is to swallow to prevent all throat clearing. NO OIL BASED VITAMINS - use powdered substitutes.    Please see patient coordinator before you leave today  to schedule sinus CT asap  Please schedule a follow up office visit in 2 weeks, sooner if needed  with all medications /inhalers/ solutions in hand so we can verify exactly what you are taking. This includes all medications from all doctors and over the counters

## 2017-04-15 ENCOUNTER — Encounter: Payer: Self-pay | Admitting: Internal Medicine

## 2017-04-15 ENCOUNTER — Ambulatory Visit (INDEPENDENT_AMBULATORY_CARE_PROVIDER_SITE_OTHER): Payer: 59 | Admitting: Internal Medicine

## 2017-04-15 VITALS — BP 132/82 | HR 88 | Ht 67.0 in | Wt 199.4 lb

## 2017-04-15 DIAGNOSIS — J45991 Cough variant asthma: Secondary | ICD-10-CM

## 2017-04-15 MED ORDER — PREDNISONE 10 MG PO TABS: ORAL_TABLET | ORAL | 0 refills | Status: DC

## 2017-04-15 NOTE — Progress Notes (Signed)
Subjective:     Patient ID: Allison Wade, female   DOB: [birth year],  .   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 trees bronchitis and had allergy testing neg / ent eval said needed surgery but never had it Dr Candi Leash then suddenly in Jan 2018 L sided nasal congestion then cough > rx zpak  Seemed better then dx as Flu at [facility] early Feb 2017 with cough that never resolved so returned to PCP but not better so rx ventolin no better then to ER 02/08/17 > better p prednisone p 3 rounds so referred to pulmonary clinic 02/11/2017 by Dr Christy Gentles [facility]      History of Present Illness  02/11/2017 1st [facility] office visit/ [facility]   Chief Complaint  Patient presents with  . Pulmonary Consult    Referred by Dr. Christy Gentles. Pt c/o cough, SOB and wheezing over the month. She occ coughs up light yellow sputum.  She states her SOB is "kind of random"- noticed more with colder weather.   finishing prednisone now but still having severe coughing fits day and night, changes in temp/ perfumes. Mostly sob during severe coughing fits rec Pantoprazole (protonix) 40 mg   Take  30-60 min before first meal of the day and Pepcid (famotidine)  20 mg one @  bedtime until return to office - this is the best way to tell whether stomach acid is contributing to your problem.   Stop allegra d and For nasal drainage / throat tickle try take CHLORPHENIRAMINE  4 mg - take one every 4 hours as needed - available over the counter- may cause drowsiness so start with just a bedtime dose or two and see how you tolerate it before trying in daytime   Add singulair 10 mg each pm until return  Take delsym two tsp every 12 hours and supplement if needed with demerol  50 mg up to 1-2 every 4 hours to suppress the urge to cough. Swallowing water or using ice  chips/non mint and menthol containing candies (such as lifesavers or sugarless jolly ranchers) are also effective.  You should rest your voice and avoid activities that you know make you cough. Once you have eliminated the cough for 3 straight days try reducing the demerol first,  then the delsym as tolerated.   GERD  Diet/   schedule sinus CT asap > neg  Please schedule a follow up office visit in 2 weeks, sooner if needed  with all medications /inhalers/ solutions in hand so we can verify exactly what you are taking. This includes all medications from all doctors and over the counters    02/24/2017 acute extended ov/[facility]ert re: recurrent cough since Jan 2018  Chief Complaint  Patient presents with  . Follow-up    Breathing has improved. She states she is not coughing as much but "it's trying to come back".  Cough is occ prod with clear to yellowish green sputum. She states had episode of SOB a few days ago, and used ventolin x 2 with no relief, so she started taking primatene tabs otc.   stopped the demerol first then Prednisone was completed on 02/18/17  And same day started back with tickle while on gerd rx and h1 once daily and continue singulair but tickle continued daily and  one night prior to OV  At bible study cough to vomit Cough worse when head hits pillow and worse p shower but no  longer green Perceived need for saba since 02/20/17 but now stops working p 30 min  rec No change in previous recommendations  Plan A = Automatic = symbicort 80 Take 2 puffs first thing in am and then another 2 puffs about 12 hours later.  Work on inhaler technique: Plan B = Backup Only use your albuterol as a rescue medication       03/25/17 NP eval  Continue on current regimen.  Begin Allegra 180mg  daily in am .  Restart Chlortrimeton 4mg  At bedtime  .  Follow med calendar closely and bring to each visit.   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 Acute Visit    Increased cough for the past few days. Cough has been non prod.    quite a bit better until acute onset "head cold "  03/30/17 and downhill since  pred always works the best   maint rx symb80 / h1 just at hs, ppi qam and h2 hs  No obvious day to day or daytime variability or assoc sob or  excess/ purulent sputum or mucus plugs or hemoptysis or cp or chest tightness, subjective wheeze or overt sinus or hb symptoms. No unusual exp hx or h/o childhood pna/ asthma or knowledge of premature birth.  Still sleeping ok without nocturnal  or early am exacerbation  of respiratory  c/o's or need for noct saba. Also denies any obvious fluctuation of symptoms with weather or environmental changes or other aggravating or alleviating factors except as outlined above   Current Medications, Allergies, Complete Past Medical History, Past Surgical History, Family History, and Social History were reviewed in [employer] record.  ROS  The following are not active complaints unless bolded sore throat, dysphagia, dental problems, itching, sneezing,  nasal congestion or excess/ purulent secretions, ear ache,   fever, chills, sweats, unintended wt loss, classically pleuritic or exertional cp,  orthopnea pnd or leg swelling, presyncope, palpitations, abdominal pain, anorexia, nausea, vomiting, diarrhea  or change in bowel or bladder habits, change in stools or urine, dysuria,hematuria,  rash, arthralgias, visual complaints, headache, numbness, weakness or ataxia or problems with walking or coordination,  change in mood/affect or memory.                     Objective:   Physical Exam    amb talkative bf  nad    04/15/2017       199   02/24/2017      193   02/11/17 199 lb (90.3 kg)  02/08/17 192 lb (87.1 kg)  11/02/16 194 lb 8 oz (88.2 kg)    Vital signs reviewed -  - Note on arrival 02 sats       HEENT: nl dentition, and oropharynx. Nl external ear canals without cough reflex - mild  bilateral non-specific turbinate edema  L >R    NECK :  without JVD/Nodes/TM/ nl carotid upstrokes bilaterally   LUNGS: no acc muscle use,  Nl contour chest which is clear to A and P bilaterally without cough on insp or exp maneuvers   CV:  RRR  no s3 or murmur or increase in P2, and no edema   ABD:  soft and  nontender with nl inspiratory excursion in the supine position. No bruits or organomegaly appreciated, bowel sounds nl  MS:  Nl gait/ ext warm without deformities, calf tenderness, cyanosis or clubbing No obvious joint restrictions   SKIN: warm and dry without lesions    NEURO:  alert, approp, nl sensorium with  no motor or cerebellar deficits apparent.      I personally reviewed images and agree with radiology impression as follows:  CXR:   02/08/17 Cardiomediastinal silhouette is normal. No pleural effusions or focal consolidations. Trachea projects midline and there is no pneumothorax. Soft tissue planes and included osseous structures are non-suspicious.   Assessment:

## 2017-04-15 NOTE — Patient Instructions (Addendum)
Prednisone 10 mg take  4 each am x 2 days,   2 each am x 2 days,  1 each am x 2 days and stop   For drainage > chlortrimeton 4 mg every 4 hours as needed  For stuffy nose > advil cold and sinus and see ENT as planned   Please schedule a follow up office visit in 2 weeks, sooner if needed  with all medications /inhalers/ solutions in hand so we can verify exactly what you are taking. This includes all medications from all doctors and over the counters

## 2017-04-17 DIAGNOSIS — E669 Obesity, unspecified: Secondary | ICD-10-CM | POA: Insufficient documentation

## 2017-04-17 NOTE — Assessment & Plan Note (Addendum)
Spirometry 02/11/2017  Completely nl - FENO 02/11/2017  =  27 on pred - Trial of singulair 02/11/2017 >>>   - Max gerd rx 02/11/2017 >>>  - Sinus CT 02/11/2017   wnl  - FENO 02/24/2017  =   81 off pred since 02/18/17  - Spirometry 02/24/2017  wnl during flare with last saba x 12 h prior  - FENO 02/24/2017  =     81 off ICS / on singulair  02/24/2017   start symbicort 80 2bid 02/2017 >  Eos  0.1/ RAST +cat/dog/cockroach, IgE 86.  - 04/15/2017  After extensive coaching HFA effectiveness =    75% continue symbicort 80    Prednisone response is suggestive of cough variant asthma, eos rhinitis or bronchitis but  still not clear even in retrospect whether this is asthma but as in many coughers it is likely after this long that there are multiple factors and empirically pushing higher doses of ICS may provoke the upper airway component of the cough so rec   Minimize exp to animals but esp in bedroom  Prednisone 10 mg take  4 each am x 2 days,   2 each am x 2 days,  1 each am x 2 days and stop  Add h1 daytime prn Keep appt with ent though doubt this will be fruitful based on CT sinus   I had an extended discussion with the patient reviewing all relevant studies completed to date and  lasting 15 to 20 minutes of a 25 minute visit    Each maintenance medication was reviewed in detail including most importantly the difference between maintenance and prns and under what circumstances the prns are to be triggered using an action plan format that is not reflected in the computer generated alphabetically organized AVS but trather by a customized med calendar that reflects the AVS meds with confirmed 100% correlation.   In addition, Please see AVS for unique instructions that I personally wrote and verbalized to the the pt in detail and then reviewed with pt  by my nurse highlighting any  changes in therapy recommended at today's visit to their plan of care.

## 2017-04-17 NOTE — Assessment & Plan Note (Signed)
Body mass index is 31.23 kg/m.  -  trending up No results found for: TSH   Contributing to gerd risk/ doe/reviewed the need and the process to achieve and maintain neg calorie balance > defer f/u primary care including intermittently monitoring thyroid status

## 2017-04-18 ENCOUNTER — Ambulatory Visit: Payer: 59 | Admitting: Internal Medicine

## 2017-04-18 NOTE — Telephone Encounter (Signed)
Looks like pt came in for her appt, will close this message

## 2017-04-19 ENCOUNTER — Other Ambulatory Visit: Payer: Self-pay | Admitting: Internal Medicine

## 2017-04-19 DIAGNOSIS — J45991 Cough variant asthma: Secondary | ICD-10-CM

## 2017-04-28 ENCOUNTER — Other Ambulatory Visit: Payer: Self-pay | Admitting: Internal Medicine

## 2017-04-28 DIAGNOSIS — J45991 Cough variant asthma: Secondary | ICD-10-CM

## 2017-05-03 ENCOUNTER — Ambulatory Visit: Payer: 59 | Admitting: Internal Medicine

## 2017-05-05 ENCOUNTER — Encounter: Payer: Self-pay | Admitting: *Deleted

## 2017-05-10 ENCOUNTER — Ambulatory Visit: Payer: 59 | Admitting: Adult Health

## 2017-05-16 ENCOUNTER — Other Ambulatory Visit: Payer: Self-pay | Admitting: Internal Medicine

## 2017-05-16 DIAGNOSIS — J45991 Cough variant asthma: Secondary | ICD-10-CM

## 2017-05-16 MED ORDER — MONTELUKAST SODIUM 10 MG PO TABS: 10.0000 mg | ORAL_TABLET | Freq: Every day | ORAL | 1 refills | Status: DC

## 2017-05-19 ENCOUNTER — Encounter: Payer: Self-pay | Admitting: Adult Health

## 2017-05-19 ENCOUNTER — Ambulatory Visit (INDEPENDENT_AMBULATORY_CARE_PROVIDER_SITE_OTHER): Payer: 59 | Admitting: Adult Health

## 2017-05-19 DIAGNOSIS — J310 Chronic rhinitis: Secondary | ICD-10-CM | POA: Diagnosis not present

## 2017-05-19 DIAGNOSIS — J45991 Cough variant asthma: Secondary | ICD-10-CM | POA: Diagnosis not present

## 2017-05-19 NOTE — Progress Notes (Signed)
Chart and office note reviewed in detail  > agree with a/p as outlined    

## 2017-05-19 NOTE — Progress Notes (Signed)
@Patient  ID: Allison Wade, female    DOB: [birth year], 41 y.o.   MRN: [id number]  Chief Complaint  Patient presents with  . Follow-up    Cough     Referring provider: Benito Mccreedy, MD  HPI: 41 year old female never smoker seen for pulmonary consult for cough. 02/11/2017  TEST  CT sinus 02/12/17 >neg  CXR 02/08/17 neg  Spirometry 02/24/2017 was normal with an FEV1 at 80%, ratio 81%, FVC 81% FENO 81 >8   05/19/2017 Follow up : Cough variant Asthma  Patient returns for a one-month follow-up. Patient is followed for cough variant asthma. She remains on Symbicort twice daily. She is treated for trigger control with reflux and allergic rhinitis prevention. Last visit. Patient had a asthma flare. She was treated with a prednisone taper. She returns today feeling some better with decreased cough and wheezing .  Denies chest pain  Orthopnea, edema or feve.r  No increased SABA use.   Under a lot of stress , parents died last year. Teaful during exam . She is widower (husband died of PE at age 14) . Marland Kitchen Single mom of 3 kids.  Support provided.     Allergies  Allergen Reactions  . Codeine Anaphylaxis and Itching  . Doxycycline Anaphylaxis and Itching  . Shellfish Allergy Shortness Of Breath and Other (See Comments)    Lips tingle  . Etodolac Other (See Comments)    Rapid heart rate  . Hydrocodone Itching and Other (See Comments)    Makes pt feel spacey  . Cefzil [Cefprozil] Rash     There is no immunization history on file for this patient.  Past Medical History:  Diagnosis Date  . Allergy   . Anemia   . Anxiety   . Headache    sinus induced migraines  . History of blood transfusion   . Hypertension    no medication, patient stated blood pressure was elevated at MD's office after hearing about the death of her best friend that morning  . PONV (postoperative nausea and vomiting)   . Sciatica     Tobacco History: History  Smoking Status  . Never Smoker    Smokeless Tobacco  . Never Used   Counseling given: Not Answered   Outpatient Encounter Prescriptions as of 05/19/2017  Medication Sig  . budesonide-formoterol (SYMBICORT) 80-4.5 MCG/ACT inhaler Inhale 2 puffs into the lungs 2 (two) times daily.  . chlorpheniramine (CHLOR-TRIMETON) 4 MG tablet Take 4 mg by mouth at bedtime.  Marland Kitchen dextromethorphan (DELSYM) 30 MG/5ML liquid 2 tsp every 12 hours as needed for cough  . famotidine (PEPCID) 20 MG tablet TAKE 1 TABLET BY MOUTH EVERY EVENING AT BEDTIME (Patient taking differently: TAKE 1 TABLET BY MOUTH EVERY EVENING)  . fexofenadine (ALLEGRA) 180 MG tablet Take 180 mg by mouth daily.  . fluticasone (FLONASE) 50 MCG/ACT nasal spray Place 2 sprays into both nostrils daily.  . montelukast (SINGULAIR) 10 MG tablet Take 1 tablet (10 mg total) by mouth at bedtime.  . pantoprazole (PROTONIX) 40 MG tablet TAKE 1 TABLET BY MOUTH ONCE DAILY 30 TO 60 MINUTES BEFORE FIRST MEAL OF EACH DAY  . VENTOLIN HFA 108 (90 Base) MCG/ACT inhaler INHALE 1 TO 2 PUFFS BY MOUTH ONCE EVERY 6 HOURS AS NEEDED  . [DISCONTINUED] predniSONE (DELTASONE) 10 MG tablet Take  4 each am x 2 days,   2 each am x 2 days,  1 each am x 2 days and stop   No facility-administered encounter medications on file  as of 05/19/2017.      Review of Systems  Constitutional:   No  weight loss, night sweats,  Fevers, chills, fatigue, or  lassitude.  HEENT:   No headaches,  Difficulty swallowing,  Tooth/dental problems, or  Sore throat,                No sneezing, itching, ear ache,  +nasal congestion, post nasal drip,   CV:  No chest pain,  Orthopnea, PND, swelling in lower extremities, anasarca, dizziness, palpitations, syncope.   GI  No heartburn, indigestion, abdominal pain, nausea, vomiting, diarrhea, change in bowel habits, loss of appetite, bloody stools.   Resp: .  No chest wall deformity  Skin: no rash or lesions.  GU: no dysuria, change in color of urine, no urgency or frequency.  No  flank pain, no hematuria   MS:  No joint pain or swelling.  No decreased range of motion.  No back pain.    Physical Exam  BP 136/89 (BP Location: Left Arm, Patient Position: Sitting, Cuff Size: Large)   Pulse 71   Ht 5\' 7"  (1.702 m)   Wt 200 lb (90.7 kg)   SpO2 100%   BMI 31.32 kg/m   GEN: A/Ox3; pleasant , NAD, well nourished    HEENT:  [place]/AT,  EACs-clear, TMs-wnl, NOSE-clear, THROAT-clear, no lesions, no postnasal drip or exudate noted.   NECK:  Supple w/ fair ROM; no JVD; normal carotid impulses w/o bruits; no thyromegaly or nodules palpated; no lymphadenopathy.    RESP  Clear  P & A; w/o, wheezes/ rales/ or rhonchi. no accessory muscle use, no dullness to percussion  CARD:  RRR, no m/r/g, no peripheral edema, pulses intact, no cyanosis or clubbing.  GI:   Soft & nt; nml bowel sounds; no organomegaly or masses detected.   Musco: Warm bil, no deformities or joint swelling noted.   Neuro: alert, no focal deficits noted.    Skin: Warm, no lesions or rashes    Lab Results:  CBC  BNP No results found for: BNP  ProBNP No results found for: PROBNP  Imaging: No results found.   Assessment & Plan:   Cough variant asthma plus component UACS Improved control  cont trigger control   Plan  Patient Instructions  Continue on current regimen.  Follow up with Dr. Melvyn Novas  Or Quintella Mura in 3-4 months and As needed       Chronic rhinitis Cont on current regimen      Rexene Edison, NP 05/19/2017

## 2017-05-19 NOTE — Assessment & Plan Note (Signed)
Improved control  cont trigger control   Plan  Patient Instructions  Continue on current regimen.  Follow up with Dr. Melvyn Novas  Or [place] in 3-4 months and As needed

## 2017-05-19 NOTE — Patient Instructions (Signed)
Continue on current regimen.  Follow up with Dr. Melvyn Novas  Or Shalice Woodring in 3-4 months and As needed

## 2017-05-19 NOTE — Assessment & Plan Note (Signed)
Cont on current regimen  

## 2017-05-25 ENCOUNTER — Other Ambulatory Visit: Payer: Self-pay | Admitting: Internal Medicine

## 2017-05-25 DIAGNOSIS — J45991 Cough variant asthma: Secondary | ICD-10-CM

## 2017-05-26 ENCOUNTER — Ambulatory Visit: Payer: 59 | Admitting: Internal Medicine

## 2017-07-27 ENCOUNTER — Other Ambulatory Visit: Payer: Self-pay | Admitting: Internal Medicine

## 2017-07-27 DIAGNOSIS — J45991 Cough variant asthma: Secondary | ICD-10-CM

## 2017-08-10 ENCOUNTER — Other Ambulatory Visit: Payer: Self-pay | Admitting: Internal Medicine

## 2017-08-10 DIAGNOSIS — J45991 Cough variant asthma: Secondary | ICD-10-CM

## 2017-08-22 ENCOUNTER — Ambulatory Visit (INDEPENDENT_AMBULATORY_CARE_PROVIDER_SITE_OTHER): Payer: 59 | Admitting: Internal Medicine

## 2017-08-22 ENCOUNTER — Encounter: Payer: Self-pay | Admitting: Internal Medicine

## 2017-08-22 VITALS — BP 132/80 | HR 69 | Ht 67.0 in | Wt 199.8 lb

## 2017-08-22 DIAGNOSIS — J310 Chronic rhinitis: Secondary | ICD-10-CM

## 2017-08-22 DIAGNOSIS — E669 Obesity, unspecified: Secondary | ICD-10-CM | POA: Diagnosis not present

## 2017-08-22 DIAGNOSIS — J45991 Cough variant asthma: Secondary | ICD-10-CM | POA: Diagnosis not present

## 2017-08-22 DIAGNOSIS — Z23 Encounter for immunization: Secondary | ICD-10-CM

## 2017-08-22 NOTE — Patient Instructions (Addendum)
Plan A = Automatic = symbicort  80 Take 2 puffs first thing in am and then another 2 puffs about 12 hours later.  Remember to blow symbicort out thru nose      Plan B = Backup Only use your albuterol as a rescue medication to be used if you can't catch your breath by resting or doing a relaxed purse lip breathing pattern.  - The less you use it, the better it will work when you need it. - Ok to use the inhaler up to 2 puffs  every 4 hours if you must but call for appointment if use goes up over your usual need - Don't leave home without it !!  (think of it like the spare tire for your car)      Please schedule a follow up visit in 6 months but call sooner if needed

## 2017-08-22 NOTE — Assessment & Plan Note (Signed)
Body mass index is 31.29 kg/m.  -  trending unchanged  No results found for: TSH   Contributing to gerd risk/ doe/reviewed the need and the process to achieve and maintain neg calorie balance > defer f/u primary care including intermittently monitoring thyroid status

## 2017-08-22 NOTE — Progress Notes (Signed)
Subjective:     Patient ID: Allison Wade, female   DOB: [birth year],  .   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 Pulmonary Consult    Referred by Dr. Christy Wade. Pt c/o cough, SOB and wheezing over the month. She occ coughs up light yellow sputum.  She states her SOB is "kind of random"- noticed more with colder weather.   finishing prednisone now but still having severe coughing fits day and night, changes in temp/ perfumes. Mostly sob during severe coughing fits rec Pantoprazole (protonix) 40 mg   Take  30-60 min before first meal of the day and Pepcid (famotidine)  20 mg one @  bedtime until return to office - this is the best way to tell whether stomach acid is contributing to your problem.   Stop allegra d and For nasal drainage / throat tickle try take CHLORPHENIRAMINE  4 mg - take one every 4 hours as needed - available over the counter- may cause drowsiness so start with just a bedtime dose or two and see how you tolerate it before trying in daytime   Add singulair 10 mg each pm until return  Take delsym two tsp every 12 hours and supplement if needed with demerol  50 mg up to 1-2 every 4 hours to suppress the urge to cough. Swallowing water or using ice  chips/non mint and menthol containing candies (such as lifesavers or sugarless jolly ranchers) are also effective.  You should rest your voice and avoid activities that you know make you cough. Once you have eliminated the cough for 3 straight days try reducing the demerol first,  then the delsym as tolerated.   GERD  Diet/   schedule sinus CT asap > neg  Please schedule a follow up office visit in 2 weeks, sooner if needed  with all medications /inhalers/ solutions in hand so we can verify exactly what you are taking. This includes all medications from all doctors and over the counters    02/24/2017 acute extended ov/Allison Wade re: recurrent cough since Jan 2018  Chief Complaint  Patient presents with  . Follow-up    Breathing has improved. She states she is not coughing as much but "it's trying to come back".  Cough is occ prod with clear to yellowish green sputum. She states had episode of SOB a few days ago, and used ventolin x 2 with no relief, so she started taking primatene tabs otc.   stopped the demerol first then Prednisone was completed on 02/18/17  And same day started back with tickle while on gerd rx and h1 once daily and continue singulair but tickle continued daily and  one night prior to OV  At bible study cough to vomit Cough worse when head hits pillow and worse p shower but no  longer green Perceived need for saba since 02/20/17 but now stops working p 30 min  rec No change in previous recommendations  Plan A = Automatic = symbicort 80 Take 2 puffs first thing in am and then another 2 puffs about 12 hours later.  Work on inhaler technique: Plan B = Backup Only use your albuterol as a rescue medication       03/25/17 NP eval  Continue on current regimen.  Begin Allegra 180mg  daily in am .  Restart Chlortrimeton 4mg  At bedtime  .  Follow med calendar closely and bring to each visit.   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 Acute Visit    Increased cough for the past few days. Cough has been non prod.    quite a bit better until acute onset "head cold "  03/30/17 and downhill since  pred always works the best  maint rx symb80 / h1 just at hs, ppi qam and h2 hs rec Prednisone 10 mg take  4 each am x 2 days,   2 each am x 2 days,  1 each am x 2 days and stop  For drainage > chlortrimeton 4 mg every 4 hours as needed For stuffy nose > advil cold and sinus and see ENT as planned    08/22/2017  f/u ov/Allison Wade re: asthma/ rhinitis maint symb 80 2bid/singulair min need for prns since last ov  Chief Complaint  Patient presents with  . Follow-up    Breathing is overall doing well. She has been able to start exercising some. She is using her albuterol inhaler 1 x per wk on average.    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 No unusual exp hx or h/o childhood pna/ asthma or knowledge of premature birth.  Sleeping ok flat without nocturnal  or early am exacerbation  of respiratory  c/o's or need for noct saba. Also denies any obvious fluctuation of symptoms with weather or environmental changes or other aggravating or alleviating factors except as outlined above   Current Allergies, Complete Past Medical History, Past Surgical History, Family History, and Social History were reviewed in [employer] record.  ROS  The following are not active complaints unless bolded sore throat, dysphagia, dental problems, itching, sneezing,  nasal  congestion or disharge of excess mucus or purulent secretions, ear ache,   fever, chills, sweats, unintended wt loss or wt gain, classically pleuritic or exertional cp,  orthopnea pnd or leg swelling, presyncope, palpitations, abdominal pain, anorexia, nausea, vomiting, diarrhea  or change in bowel habits or bladder habits, change in stools or change in urine, dysuria, hematuria,  rash, arthralgias, visual complaints, headache, numbness, weakness or ataxia or problems with walking or coordination,  change in mood/affect or memory.  Current Meds  Medication Sig  . budesonide-formoterol (SYMBICORT) 80-4.5 MCG/ACT inhaler Inhale 2 puffs into the lungs 2 (two) times daily.  . chlorpheniramine (CHLOR-TRIMETON) 4 MG tablet Take 4 mg by mouth at bedtime.  Marland Kitchen dextromethorphan (DELSYM) 30 MG/5ML liquid 2 tsp every 12 hours as needed for cough  . famotidine (PEPCID) 20 MG tablet TAKE 1 TABLET BY MOUTH EVERY EVENING AT BEDTIME  . fexofenadine (ALLEGRA) 180 MG tablet Take 180 mg by mouth daily.  . fluticasone (FLONASE) 50 MCG/ACT nasal spray Place 2 sprays into both nostrils daily.  . montelukast (SINGULAIR) 10 MG tablet Take 1 tablet (10 mg total) by mouth at bedtime.  . pantoprazole (PROTONIX) 40 MG tablet TAKE 1 TABLET BY MOUTH ONCE DAILY 30 TO 60 MINUTES BEFORE FIRST MEAL OF EACH DAY  . VENTOLIN HFA 108 (90 Base) MCG/ACT inhaler INHALE 1 TO 2 PUFFS BY MOUTH ONCE EVERY 6 HOURS AS NEEDED                             Objective:   Physical Exam    amb talkative bf  nad  - all smiles  08/22/2017       199    04/15/2017       199   02/24/2017      193   02/11/17 199 lb (90.3 kg)  02/08/17 192 lb (87.1 kg)  11/02/16 194 lb 8 oz (88.2 kg)    Vital signs reviewed -  - Note on arrival 02 sats  99% RA    HEENT: nl dentition, and oropharynx. Nl external ear canals without cough reflex - mild  bilateral non-specific turbinate edema  L >R  s viz secretions   NECK :  without JVD/Nodes/TM/ nl  carotid upstrokes bilaterally   LUNGS: no acc muscle use,  Nl contour chest which is clear to A and P bilaterally without cough on insp or exp maneuvers   CV:  RRR  no s3 or murmur or increase in P2, and no edema   ABD:  soft and nontender with nl inspiratory excursion in the supine position. No bruits or organomegaly appreciated, bowel sounds nl  MS:  Nl gait/ ext warm without deformities, calf tenderness, cyanosis or clubbing No obvious joint restrictions   SKIN: warm and dry without lesions    NEURO:  alert, approp, nl sensorium with  no motor or cerebellar deficits apparent.         Assessment:

## 2017-08-22 NOTE — Assessment & Plan Note (Signed)
Continue flonase/singulair and prn 1st gen H1 blockers per guidelines    F/u shoemaker planned

## 2017-08-22 NOTE — Assessment & Plan Note (Signed)
Spirometry 02/11/2017  Completely nl - FENO 02/11/2017  =  27 on pred - Trial of singulair 02/11/2017 >>>   - Max gerd rx 02/11/2017 >>>  - Sinus CT 02/11/2017   wnl  - FENO 02/24/2017  =   81 off pred since 02/18/17  - Spirometry 02/24/2017  wnl during flare with last saba x 12 h prior  - FENO 02/24/2017  =     81 off ICS / on singulair  02/24/2017   start symbicort 80 2bid 02/2017 >  Eos  0.1/ RAST +cat/dog/cockroach, IgE 86.   - 08/22/2017   After extensive coaching HFA effectiveness =    90% continue symbicort 80 2bid/ singulair    All goals of chronic asthma control met including optimal function and elimination of symptoms with minimal need for rescue therapy.  Contingencies discussed in full including contacting this office immediately if not controlling the symptoms using the rule of two's.     Needs ent f/u > shoemaker   I had an extended discussion with the patient reviewing all relevant studies completed to date and  lasting 15 to 20 minutes of a 25 minute visit    Each maintenance medication was reviewed in detail including most importantly the difference between maintenance and prns and under what circumstances the prns are to be triggered using an action plan format that is not reflected in the computer generated alphabetically organized AVS.    Please see AVS for specific instructions unique to this visit that I personally wrote and verbalized to the the pt in detail and then reviewed with pt  by my nurse highlighting any  changes in therapy recommended at today's visit to their plan of care.

## 2017-08-31 ENCOUNTER — Other Ambulatory Visit: Payer: Self-pay | Admitting: Internal Medicine

## 2017-08-31 DIAGNOSIS — J45991 Cough variant asthma: Secondary | ICD-10-CM

## 2017-08-31 MED ORDER — PANTOPRAZOLE SODIUM 40 MG PO TBEC: DELAYED_RELEASE_TABLET | ORAL | 1 refills | Status: DC

## 2017-09-18 ENCOUNTER — Other Ambulatory Visit: Payer: Self-pay | Admitting: Internal Medicine

## 2017-09-18 DIAGNOSIS — J45991 Cough variant asthma: Secondary | ICD-10-CM

## 2017-09-26 ENCOUNTER — Other Ambulatory Visit: Payer: Self-pay | Admitting: Internal Medicine

## 2017-09-26 DIAGNOSIS — J45991 Cough variant asthma: Secondary | ICD-10-CM

## 2017-10-10 IMAGING — CT CT RENAL STONE PROTOCOL
2 of 4 series · 16 of 46 positions shown, 18 images · non-contrast
Comparison: 05/15/2011

CLINICAL DATA: Left flank and groin pain today.

EXAM:
CT ABDOMEN AND PELVIS WITHOUT CONTRAST
TECHNIQUE: Multidetector CT imaging of the abdomen and pelvis was performed
following the standard protocol without IV contrast.

[Series 2: axial st · axial · 0.75mm/px · z∈[-780,-360]mm · 13 of 92 slices shown, 15 images]
[im 4/92  soft-tissue]
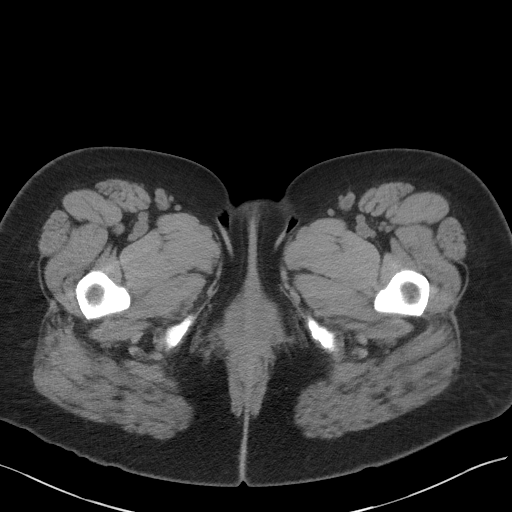
[im 4/92  bone]
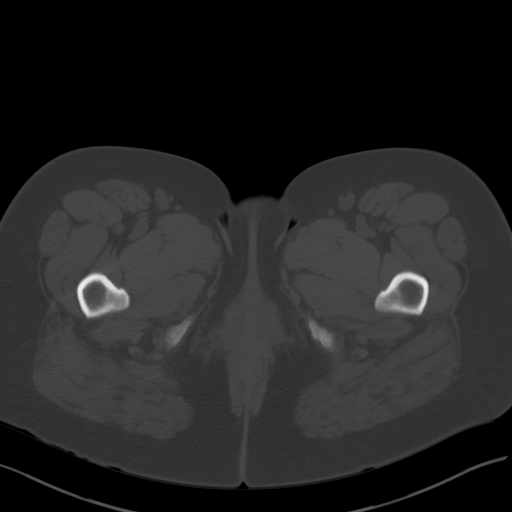
[im 11/92  soft-tissue]
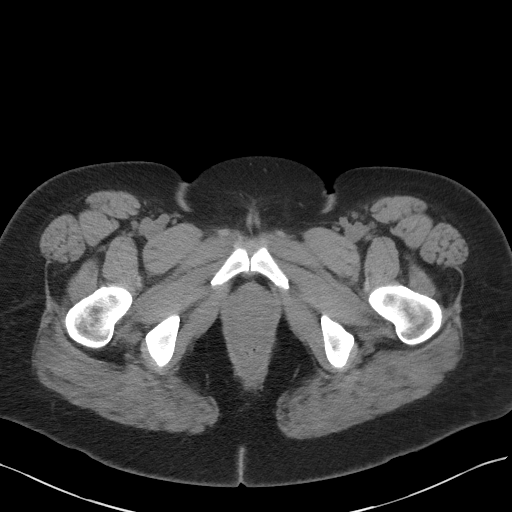
[im 18/92  soft-tissue]
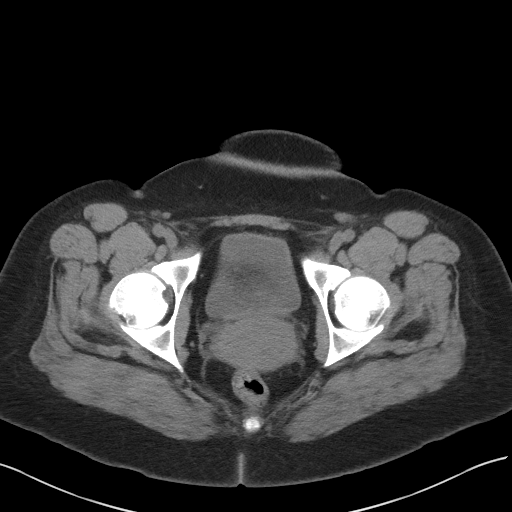
[im 25/92  soft-tissue]
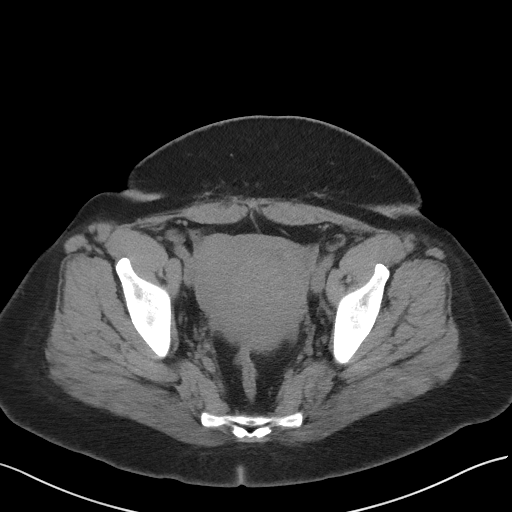
[im 32/92  soft-tissue]
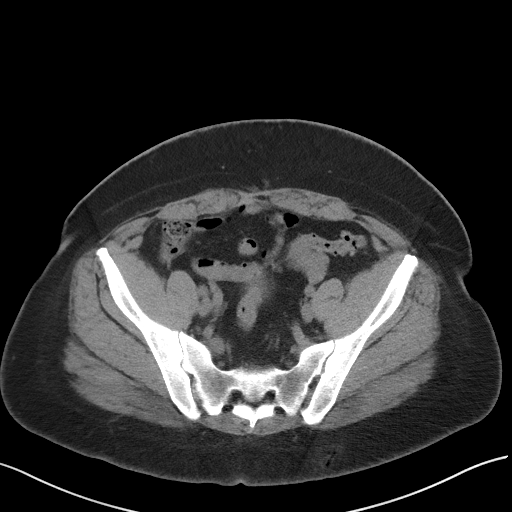
[im 39/92  soft-tissue]
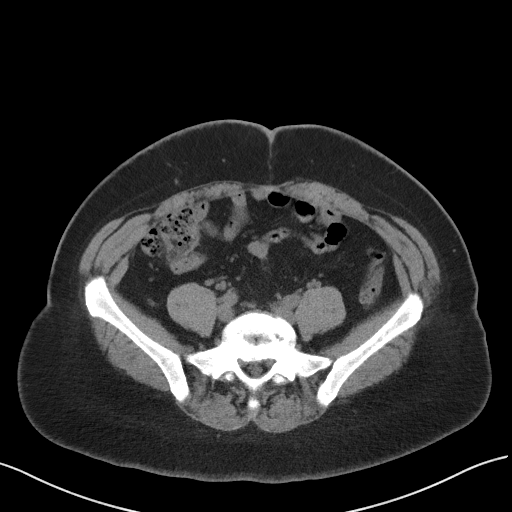
[im 46/92  soft-tissue]
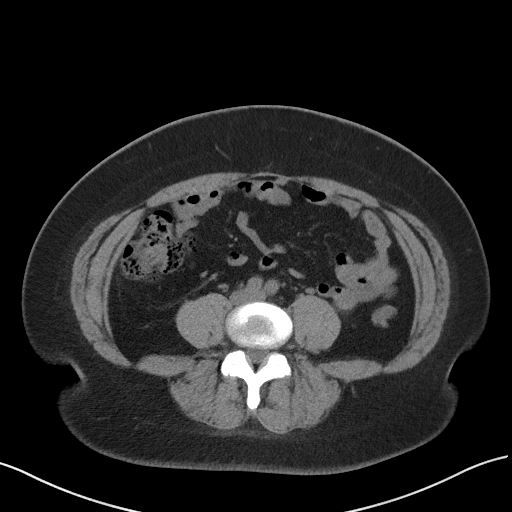
[im 53/92  soft-tissue]
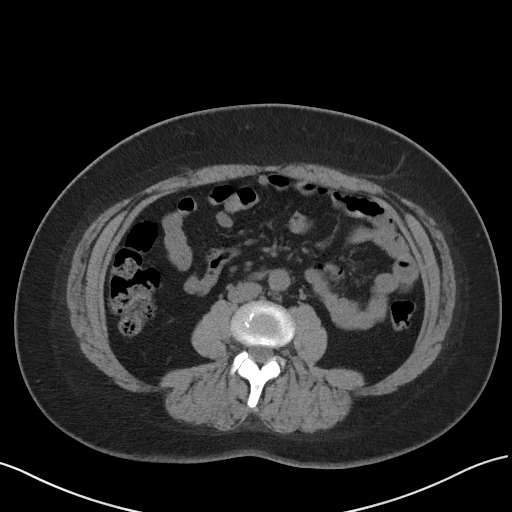
[im 60/92  soft-tissue]
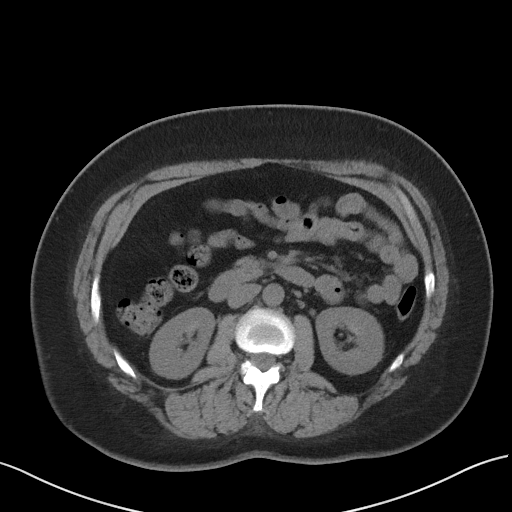
[im 60/92  bone]
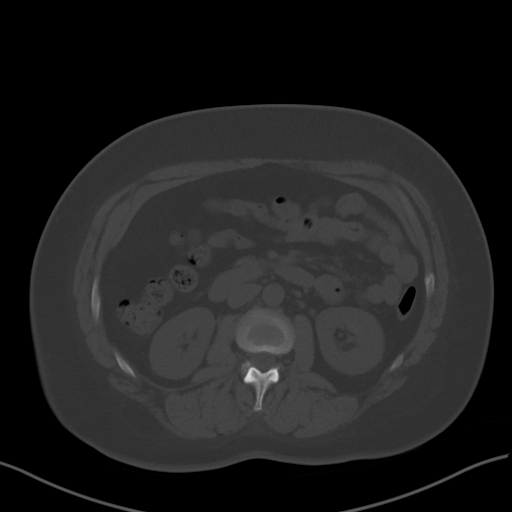
[im 67/92  soft-tissue]
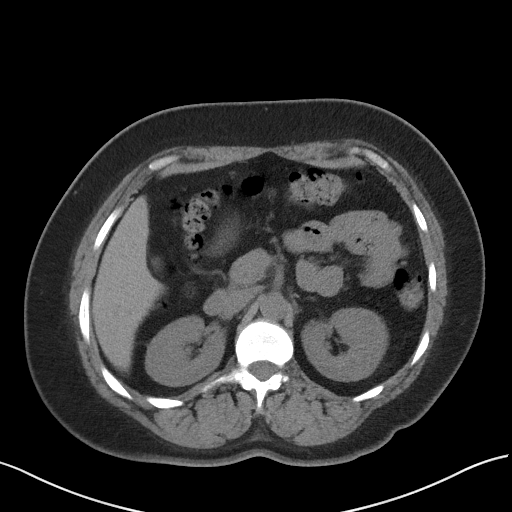
[im 74/92  soft-tissue]
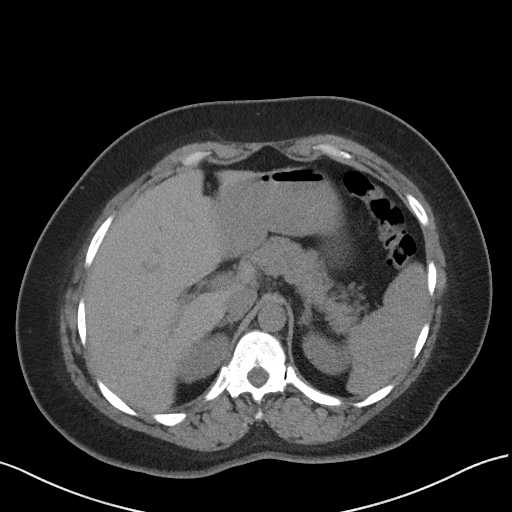
[im 81/92  soft-tissue]
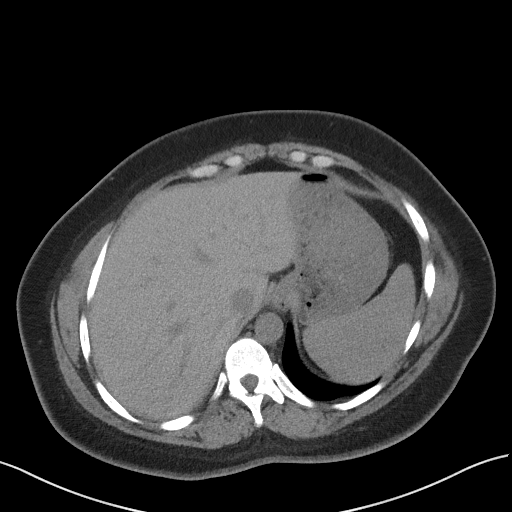
[im 88/92  soft-tissue]
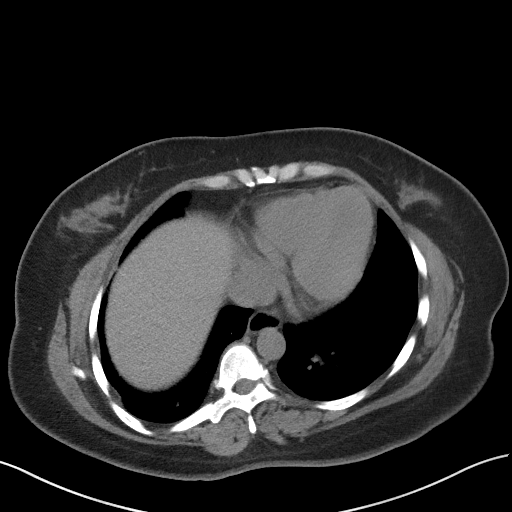

[Series 5: coronal · coronal · 0.77mm/px · 3 of 138 slices shown]
[im 46/138  soft-tissue]
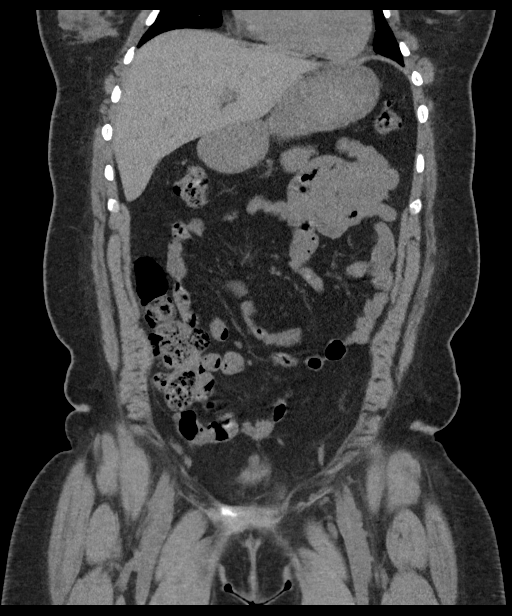
[im 61/138  soft-tissue]
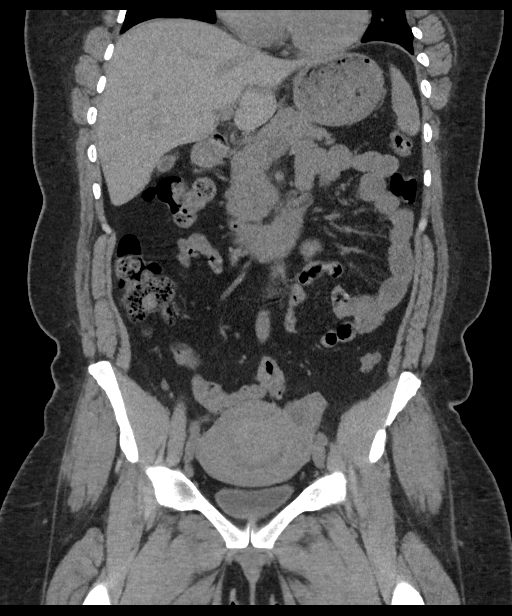
[im 77/138  soft-tissue]
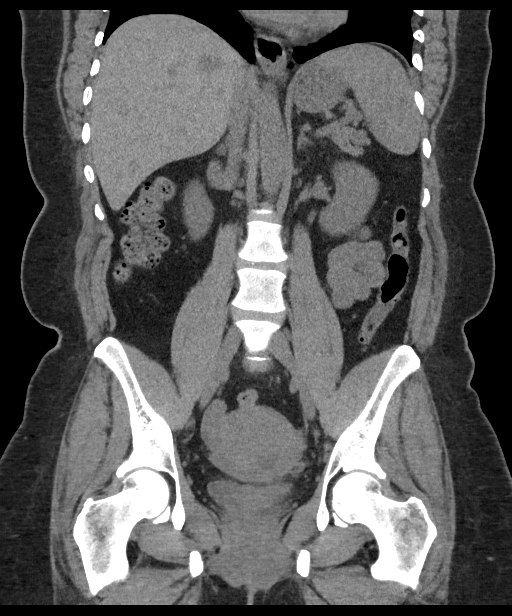

[16 of 46 positions shown; findings below may reference images not displayed]

FINDINGS: Lower chest: The lung bases are clear of acute process. No pleural
effusion or pulmonary lesions. The heart is normal in size. No
pericardial effusion. The distal esophagus and aorta are
unremarkable. Small hiatal hernia.

Hepatobiliary: No focal hepatic lesions or intrahepatic biliary
dilatation. The gallbladder is contracted. No common bile duct
dilatation.

Pancreas: No mass, inflammation or ductal dilatation.

Spleen: Normal size.  No focal lesions.

Adrenals/Urinary Tract: The adrenal glands and kidneys are normal.
No renal, ureteral or bladder calculi or mass. A urachal remnant is
noted.

Stomach/Bowel: The stomach, duodenum, small bowel and colon are
grossly normal without oral contrast. No inflammatory changes, mass
lesions or obstructive findings. The terminal ileum and appendix are
normal.

Vascular/Lymphatic: No mesenteric or retroperitoneal mass or
adenopathy. The aorta is normal in caliber. No atherosclerotic
calcifications.

Reproductive: The uterus is mildly enlarged. 8 measures 10.8 x 6.6 x
8.7 cm. There is a large anterior myometrial fibroid measuring 5.2 x
4.0 cm. There is mass effect on the endometrium. The right ovary is
normal. The left ovary contains a simple appearing cyst.

Other: No pelvic mass or adenopathy. No free pelvic fluid
collections. No inguinal mass overt adenopathy. No abdominal wall
hernia or subcutaneous lesions.

Musculoskeletal: No significant bony findings.
IMPRESSION: 1. No renal, ureteral or bladder calculi or mass.
2. Scattered descending colon and sigmoid colon diverticulosis but
no findings for acute diverticulitis.
3. 5 cm fibroid with mild mass effect on the endometrium.
4. Simple appearing left ovarian cyst.
5. Normal terminal ileum and appendix.
6. Markedly contracted gallbladder.

## 2017-10-28 ENCOUNTER — Other Ambulatory Visit: Payer: Self-pay | Admitting: Internal Medicine

## 2017-10-28 MED ORDER — BUDESONIDE-FORMOTEROL FUMARATE 80-4.5 MCG/ACT IN AERO: 2.0000 | INHALATION_SPRAY | Freq: Two times a day (BID) | RESPIRATORY_TRACT | 3 refills | Status: DC

## 2017-10-31 ENCOUNTER — Other Ambulatory Visit: Payer: Self-pay | Admitting: Internal Medicine

## 2017-10-31 MED ORDER — BUDESONIDE-FORMOTEROL FUMARATE 80-4.5 MCG/ACT IN AERO: 2.0000 | INHALATION_SPRAY | Freq: Two times a day (BID) | RESPIRATORY_TRACT | 3 refills | Status: DC

## 2017-11-23 ENCOUNTER — Other Ambulatory Visit: Payer: Self-pay

## 2017-11-23 MED ORDER — ALBUTEROL SULFATE HFA 108 (90 BASE) MCG/ACT IN AERS: 2.0000 | INHALATION_SPRAY | Freq: Four times a day (QID) | RESPIRATORY_TRACT | 2 refills | Status: DC | PRN

## 2018-02-17 ENCOUNTER — Other Ambulatory Visit: Payer: Self-pay | Admitting: Internal Medicine

## 2018-02-17 DIAGNOSIS — J45991 Cough variant asthma: Secondary | ICD-10-CM

## 2018-02-17 MED ORDER — FAMOTIDINE 20 MG PO TABS: ORAL_TABLET | ORAL | 2 refills | Status: DC

## 2018-02-20 ENCOUNTER — Telehealth: Payer: Self-pay | Admitting: Internal Medicine

## 2018-02-20 ENCOUNTER — Ambulatory Visit: Payer: 59 | Admitting: Internal Medicine

## 2018-02-20 MED ORDER — BUDESONIDE-FORMOTEROL FUMARATE 80-4.5 MCG/ACT IN AERO: 2.0000 | INHALATION_SPRAY | Freq: Two times a day (BID) | RESPIRATORY_TRACT | 1 refills | Status: DC

## 2018-02-20 NOTE — Telephone Encounter (Signed)
rx refill done

## 2018-02-21 ENCOUNTER — Ambulatory Visit: Payer: 59 | Admitting: Internal Medicine

## 2018-03-06 ENCOUNTER — Ambulatory Visit: Payer: 59 | Admitting: Internal Medicine

## 2018-04-03 IMAGING — DX DG CHEST 1V PORT
1 series · 1 of 1 positions shown · non-contrast
Comparison: None.

CLINICAL DATA: Cough and shortness of breath.

EXAM:
PORTABLE CHEST 1 VIEW

[chest ap]
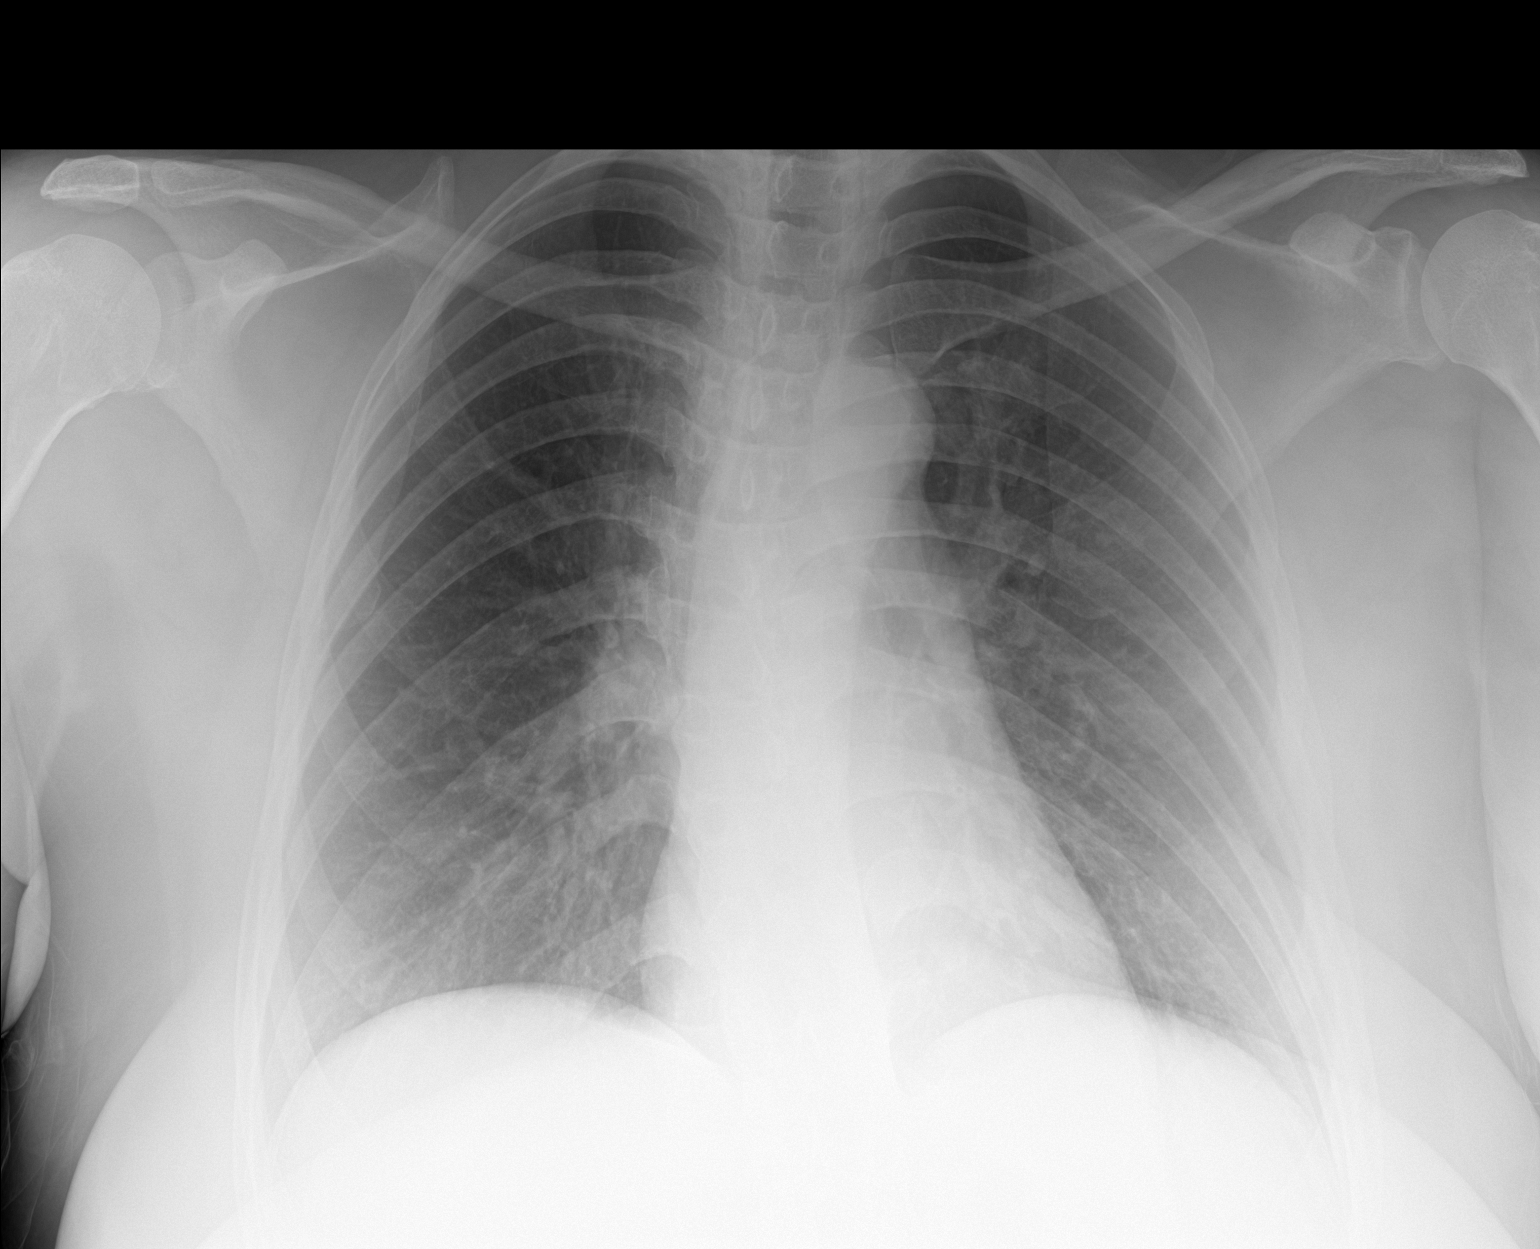

[1 of 1 positions shown; findings below may reference images not displayed]

FINDINGS: Cardiomediastinal silhouette is normal. No pleural effusions or
focal consolidations. Trachea projects midline and there is no
pneumothorax. Soft tissue planes and included osseous structures are
non-suspicious.
IMPRESSION: Normal chest.

## 2018-05-29 ENCOUNTER — Other Ambulatory Visit: Payer: Self-pay | Admitting: Internal Medicine

## 2018-05-29 DIAGNOSIS — J45991 Cough variant asthma: Secondary | ICD-10-CM

## 2018-06-05 ENCOUNTER — Other Ambulatory Visit: Payer: Self-pay | Admitting: Internal Medicine

## 2018-06-05 DIAGNOSIS — J45991 Cough variant asthma: Secondary | ICD-10-CM

## 2018-07-09 ENCOUNTER — Other Ambulatory Visit: Payer: Self-pay | Admitting: Internal Medicine

## 2018-07-09 DIAGNOSIS — J45991 Cough variant asthma: Secondary | ICD-10-CM

## 2018-09-08 ENCOUNTER — Other Ambulatory Visit: Payer: Self-pay | Admitting: Gynecology

## 2018-09-08 DIAGNOSIS — N6489 Other specified disorders of breast: Secondary | ICD-10-CM

## 2018-09-18 ENCOUNTER — Other Ambulatory Visit: Payer: 59

## 2018-09-28 ENCOUNTER — Ambulatory Visit: Payer: 59 | Admitting: Internal Medicine

## 2018-09-28 ENCOUNTER — Ambulatory Visit
Admission: RE | Admit: 2018-09-28 | Discharge: 2018-09-28 | Disposition: A | Discharge status: Home / Self Care | Payer: 59 | Source: Ambulatory Visit | Attending: Gynecology | Admitting: Gynecology

## 2018-09-28 ENCOUNTER — Ambulatory Visit: Payer: 59

## 2018-09-28 ENCOUNTER — Encounter: Payer: Self-pay | Admitting: Internal Medicine

## 2018-09-28 VITALS — BP 124/86 | HR 86 | Ht 67.0 in | Wt 204.8 lb

## 2018-09-28 DIAGNOSIS — J45991 Cough variant asthma: Secondary | ICD-10-CM | POA: Diagnosis not present

## 2018-09-28 DIAGNOSIS — Z23 Encounter for immunization: Secondary | ICD-10-CM

## 2018-09-28 DIAGNOSIS — N6489 Other specified disorders of breast: Secondary | ICD-10-CM

## 2018-09-28 DIAGNOSIS — J310 Chronic rhinitis: Secondary | ICD-10-CM | POA: Diagnosis not present

## 2018-09-28 LAB — NITRIC OXIDE: Nitric Oxide: 11

## 2018-09-28 MED ORDER — PREDNISONE 10 MG PO TABS: ORAL_TABLET | ORAL | 0 refills | Status: DC

## 2018-09-28 NOTE — Progress Notes (Signed)
Subjective:     Patient ID: Allison Wade, female   DOB: [birth year],  .   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 Pulmonary Consult    Referred by Dr. Christy Gentles. Pt c/o cough, SOB and wheezing over the month. She occ coughs up light yellow sputum.  She states her SOB is "kind of random"- noticed more with colder weather.   finishing prednisone now but still having severe coughing fits day and night, changes in temp/ perfumes. Mostly sob during severe coughing fits rec Pantoprazole (protonix) 40 mg   Take  30-60 min before first meal of the day and Pepcid (famotidine)  20 mg one @  bedtime until return to office - this is the best way to tell whether stomach acid is contributing to your problem.   Stop allegra d and For nasal drainage / throat tickle try take CHLORPHENIRAMINE  4 mg - take one every 4 hours as needed - available over the counter- may cause drowsiness so start with just a bedtime dose or two and see how you tolerate it before trying in daytime   Add singulair 10 mg each pm until return  Take delsym two tsp every 12 hours and supplement if needed with demerol  50 mg up to 1-2 every 4 hours to suppress the urge to cough. Swallowing water or using ice  chips/non mint and menthol containing candies (such as lifesavers or sugarless jolly ranchers) are also effective.  You should rest your voice and avoid activities that you know make you cough. Once you have eliminated the cough for 3 straight days try reducing the demerol first,  then the delsym as tolerated.   GERD  Diet/   schedule sinus CT asap > neg  Please schedule a follow up office visit in 2 weeks, sooner if needed  with all medications /inhalers/ solutions in hand so we can verify exactly what you are taking. This includes all medications from all doctors and over the counters   08/22/2017  f/u ov/Allison Wade re: asthma/ rhinitis maint symb 80 2bid/singulair min need for prns since last ov  Chief Complaint  Patient presents with  . Follow-up    Breathing is overall doing well. She has been able to start exercising some. She is using her albuterol inhaler 1 x per wk on average.   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  Remember to blow symbicort out thru nose  Plan B = Backup Only use your albuterol as a rescue medication     09/28/2018 acute extended ov/Allison Wade re: acute nasal symptoms/ chronic asthma maint on symb 80/singulair  Chief Complaint  Patient presents with  . Acute Visit    She c/o sinus pressure/HA and nasal congestion for the past wk. She uses her albuterol inhaler once per wk on average.   despite using  flonase daily now with gradually worsening nasal congestion/ pain R frontal/R ear no purulent secretions/ h1 not helping / has not returned to ENT as prev rec  No obvious day to day or daytime variability or assoc excess/ purulent sputum or  mucus plugs or hemoptysis or cp or chest tightness, subjective wheeze or overt   hb symptoms.   Also denies any obvious fluctuation of symptoms with weather or environmental changes or other aggravating or alleviating factors except as outlined above   No unusual exposure hx or h/o childhood pna/ asthma or knowledge of premature birth.  Current Allergies, Complete Past Medical History, Past Surgical History, Family History, and Social History were reviewed in [employer] record.  ROS  The following are not active complaints unless bolded Hoarseness, sore throat, dysphagia, dental problems, itching, sneezing,  nasal congestion or discharge of excess mucus or purulent secretions, ear ache,   fever, chills, sweats, unintended wt loss or wt gain, classically pleuritic or exertional cp,  orthopnea pnd or arm/hand swelling  or leg swelling, presyncope, palpitations, abdominal pain, anorexia, nausea, vomiting, diarrhea  or change in bowel habits or change in bladder habits, change in stools or change in urine, dysuria, hematuria,  rash, arthralgias, visual complaints, headache, numbness, weakness or ataxia or problems with walking or coordination,  change in mood or  memory.        Current Meds  Medication Sig  . albuterol (VENTOLIN HFA) 108 (90 Base) MCG/ACT inhaler Inhale 2 puffs into the lungs every 6 (six) hours as needed for wheezing or shortness of breath.  . budesonide-formoterol (SYMBICORT) 80-4.5 MCG/ACT inhaler Inhale 2 puffs into the lungs 2 (two) times daily.  . chlorpheniramine (CHLOR-TRIMETON) 4 MG tablet Take 4 mg by mouth at bedtime.  Marland Kitchen dextromethorphan (DELSYM) 30 MG/5ML liquid 2 tsp every 12 hours as needed for cough  . famotidine (PEPCID) 20 MG tablet TAKE 1 TABLET BY MOUTH EVERY EVENING AT BEDTIME  . fexofenadine (ALLEGRA) 180 MG tablet Take 180 mg by mouth daily.  . fluticasone (FLONASE) 50 MCG/ACT nasal spray Place 2 sprays into both nostrils daily.  .  montelukast (SINGULAIR) 10 MG tablet Take 1 tablet (10 mg total) by mouth at bedtime.  . pantoprazole (PROTONIX) 40 MG tablet TAKE 1 TABLET BY MOUTH ONCE DAILY 30 TO 60 MINUTES BEFORE FIRST MEAL OF EACH DAY                 Objective:   Physical Exam    09/28/2018     204  08/22/2017       199    04/15/2017       199   02/24/2017      193   02/11/17 199 lb (90.3 kg)  02/08/17 192 lb (87.1 kg)  11/02/16 194 lb 8 oz (88.2 kg)    amb bf nad   Vital signs reviewed - Note on arrival 02 sats  100%  on RA      HEENT: nl dentition and oropharynx. Both   external ear canals blocked with wax and mild bilateral non-specific turbinate edema  s purulent secretions  NECK :  without JVD/Nodes/TM/ nl carotid upstrokes bilaterally   LUNGS: no acc muscle use,  Nl contour chest which is clear to A and P bilaterally without cough on insp or exp maneuvers   CV:  RRR  no s3 or murmur or increase in P2, and no edema   ABD:  soft and nontender with nl inspiratory excursion in the supine position. No bruits or organomegaly appreciated, bowel sounds nl  MS:  Nl gait/ ext warm without deformities, calf tenderness, cyanosis or clubbing No obvious joint restrictions   SKIN: warm and dry without lesions    NEURO:  alert, approp, nl sensorium with  no motor or cerebellar deficits apparent.           Assessment:

## 2018-09-28 NOTE — Patient Instructions (Addendum)
For stuffy nose don't take chlorpheniramine but ok to allegra and Allegra D if allegra doesn't work   I emphasized that nasal steroids have no immediate benefit in terms of improving symptoms.  To help them reached the target tissue, the patient should use Sinex  two puffs every 12 hours applied one min before using the nasal steroids.  Afrin should be stopped after no more than 5 days.  If the symptoms worsen, Afrin can be restarted after 5 days off of therapy to prevent rebound congestion from overuse of Afrin.  I also emphasized that in no way are nasal steroids a concern in terms of "addiction".   If not opening up > Prednisone 10 mg take  4 each am x 2 days,   2 each am x 2 days,  1 each am x 2 days and stop    If not better you need to see Dr Wilburn Cornelia   Work on inhaler technique:  relax and gently blow all the way out then take a nice smooth deep breath back in, triggering the inhaler at same time you start breathing in.  Hold for up to 5 seconds if you can. Blow out thru nose. Rinse and gargle with water when done   To get the most out of exercise, you need to be continuously aware that you are short of breath, but never out of breath, for 30 minutes daily. As you improve, it will actually be easier for you to do the same a      Please schedule a follow up visit in 12  months but call sooner if needed

## 2018-10-01 ENCOUNTER — Encounter: Payer: Self-pay | Admitting: Internal Medicine

## 2018-10-01 NOTE — Assessment & Plan Note (Signed)
Reviewed with pt: I emphasized that nasal steroids have no immediate benefit in terms of improving symptoms.  To help them reached the target tissue, the patient should use Afrin two puffs every 12 hours applied one min before using the nasal steroids.  Afrin should be stopped after no more than 5 days.  If the symptoms worsen, Afrin can be restarted after 5 days off of therapy to prevent rebound congestion from overuse of Afrin.  I also emphasized that in no way are nasal steroids a concern in terms of "addiction".    >>> rec use oxymetazolone as above and hold 1st gen h1 when stuffy/ replace with allegra or allegra d and f/u with ENT    I had an extended discussion with the patient reviewing all relevant studies completed to date and  lasting 15 to 20 minutes of a 25 minute visit    See device teaching which extended face to face time for this visit.  Each maintenance medication was reviewed in detail including emphasizing most importantly the difference between maintenance and prns and under what circumstances the prns are to be triggered using an action plan format that is not reflected in the computer generated alphabetically organized AVS which I have not found useful in most complex patients, especially with respiratory illnesses  Please see AVS for specific instructions unique to this visit that I personally wrote and verbalized to the the pt in detail and then reviewed with pt  by my nurse highlighting any  changes in therapy recommended at today's visit to their plan of care.

## 2018-10-01 NOTE — Assessment & Plan Note (Addendum)
Spirometry 02/11/2017  Completely nl - FENO 02/11/2017  =  27 on pred - Trial of singulair 02/11/2017 >>>   - Max gerd rx 02/11/2017 >>>  - Sinus CT 02/11/2017   wnl  - FENO 02/24/2017  =   81 off pred since 02/18/17  - Spirometry 02/24/2017  wnl during flare with last saba x 12 h prior  - FENO 02/24/2017  =     81 off ICS / on singulair  02/24/2017   start symbicort 80 2bid 02/2017 >  Eos  0.1/ RAST +cat/dog/cockroach, IgE 86.   - 09/28/2018  After extensive coaching inhaler device,  effectiveness =    75%  -  FENO 09/28/2018  =   11 on symb 80/singulair > rec continue    Despite flare of rhinitis and suboptimal hfa/ all goals of chronic asthma control met including optimal function and elimination of symptoms with minimal need for rescue therapy.  Contingencies discussed in full including contacting this office immediately if not controlling the symptoms using the rule of two's.      >>  F/u yearly for refills, sooner prn

## 2018-10-21 ENCOUNTER — Other Ambulatory Visit: Payer: Self-pay

## 2018-10-24 ENCOUNTER — Telehealth: Payer: Self-pay | Admitting: Internal Medicine

## 2018-10-24 DIAGNOSIS — J45991 Cough variant asthma: Secondary | ICD-10-CM

## 2018-10-24 MED ORDER — MONTELUKAST SODIUM 10 MG PO TABS: 10.0000 mg | ORAL_TABLET | Freq: Every day | ORAL | 1 refills | Status: DC

## 2018-10-24 MED ORDER — PANTOPRAZOLE SODIUM 40 MG PO TBEC: DELAYED_RELEASE_TABLET | ORAL | 5 refills | Status: DC

## 2018-10-24 NOTE — Telephone Encounter (Signed)
Spoke with patient and have sent in needed refills at this time.

## 2019-01-14 ENCOUNTER — Other Ambulatory Visit: Payer: Self-pay | Admitting: Internal Medicine

## 2019-01-14 DIAGNOSIS — J45991 Cough variant asthma: Secondary | ICD-10-CM

## 2019-02-01 ENCOUNTER — Telehealth: Payer: Self-pay | Admitting: Internal Medicine

## 2019-02-01 MED ORDER — ALBUTEROL SULFATE HFA 108 (90 BASE) MCG/ACT IN AERS: 2.0000 | INHALATION_SPRAY | Freq: Four times a day (QID) | RESPIRATORY_TRACT | 2 refills | Status: AC | PRN

## 2019-02-01 NOTE — Telephone Encounter (Signed)
Spoke with the pt  She is requesting refill on her ventolin  Rx was sent  Nothing further needed

## 2019-04-19 ENCOUNTER — Other Ambulatory Visit: Payer: Self-pay | Admitting: Internal Medicine

## 2019-04-19 DIAGNOSIS — J45991 Cough variant asthma: Secondary | ICD-10-CM

## 2019-04-23 ENCOUNTER — Other Ambulatory Visit: Payer: Self-pay | Admitting: Internal Medicine

## 2019-09-07 ENCOUNTER — Telehealth: Payer: Self-pay | Admitting: Internal Medicine

## 2019-09-07 NOTE — Telephone Encounter (Signed)
Left message for patient to call back  

## 2019-09-10 NOTE — Telephone Encounter (Signed)
Called and spoke to patient. Patient stated that she has upcoming OV with Dr. Melvyn Novas 10/01/2019. Patient stated that work is accepting her working from home until after her office visit.  Patient is afraid to go back to work and will like to discuss this during her next OV.   Sending to Dr. Melvyn Novas as Juluis Rainier.

## 2019-10-01 ENCOUNTER — Other Ambulatory Visit: Payer: Self-pay

## 2019-10-01 ENCOUNTER — Encounter: Payer: Self-pay | Admitting: Internal Medicine

## 2019-10-01 ENCOUNTER — Ambulatory Visit: Payer: 59 | Admitting: Internal Medicine

## 2019-10-01 DIAGNOSIS — J310 Chronic rhinitis: Secondary | ICD-10-CM | POA: Diagnosis not present

## 2019-10-01 DIAGNOSIS — J45991 Cough variant asthma: Secondary | ICD-10-CM

## 2019-10-01 DIAGNOSIS — Z23 Encounter for immunization: Secondary | ICD-10-CM | POA: Diagnosis not present

## 2019-10-01 NOTE — Patient Instructions (Addendum)
No change in medications.   Work on perfect  inhaler technique:  relax and gently blow all the way out then take a nice smooth deep breath back in, triggering the inhaler at same time you start breathing in.  Hold for up to 5 seconds if you can. Blow out thru nose. Rinse and gargle with water when done.   You should opt out of working other than  from home as long as possible and and only  to return under CDC workplace restrictions that 100% followed.    Please schedule a follow up visit in 12  months but call sooner if needed.

## 2019-10-01 NOTE — Assessment & Plan Note (Signed)
Never smoker Spirometry 02/11/2017  Completely nl - FENO 02/11/2017  =  27 on pred - Trial of singulair 02/11/2017 >>>   - Max gerd rx 02/11/2017 >>>  - Sinus CT 02/11/2017   wnl  - FENO 02/24/2017  =   81 off pred since 02/18/17  - Spirometry 02/24/2017  wnl during flare with last saba x 12 h prior  - FENO 02/24/2017  =     81 off ICS / on singulair  02/24/2017   start symbicort 80 2bid 02/2017 >  Eos  0.1/ RAST +cat/dog/cockroach, IgE 86.  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  Contingencies discussed in full including contacting this office immediately if not controlling the symptoms using the rule of two's.     Pt informed of the seriousness of COVID 19 infection as a direct risk to their health  and safey and to those of their loved ones and should continue to wear facemask in public and minimize exposure to public locations but especially avoid any area or activity where non-close contacts are not observing distancing or wearing an appropriate face mask.  Should work from home unless cdc guidelines being strictly followed at work   I had an extended discussion with the patient reviewing all relevant studies completed to date and  lasting 15 to 20 minutes of a 25 minute visit    I performed detailed device teaching using a teach back method which extended face to face time for this visit (see above)  Each maintenance medication was reviewed in detail including emphasizing most importantly the difference between maintenance and prns and under what circumstances the prns are to be triggered using an action plan  format that is not reflected in the computer generated alphabetically organized AVS which I have not found useful in most complex patients, especially with respiratory illnesses  Please see AVS for specific instructions unique to this visit that I personally wrote and verbalized to the the pt in detail and then reviewed with pt  by my nurse highlighting any  changes in therapy recommended at today's visit to their plan of care.

## 2019-10-01 NOTE — Progress Notes (Signed)
Subjective:     Patient ID: Allison Wade, female   DOB: August 18, 1976  .   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 Pulmonary Consult    Referred by Dr. Christy Gentles. Pt c/o cough, SOB and wheezing over the month. She occ coughs up light yellow sputum.  She states her SOB is "kind of random"- noticed more with colder weather.   finishing prednisone now but still having severe coughing fits day and night, changes in temp/ perfumes. Mostly sob during severe coughing fits rec Pantoprazole (protonix) 40 mg   Take  30-60 min before first meal of the day and Pepcid (famotidine)  20 mg one @  bedtime until return to office - this is the best way to tell whether stomach acid is contributing to your problem.   Stop allegra d and For nasal drainage / throat tickle try take CHLORPHENIRAMINE  4 mg - take one every 4 hours as needed - available over the counter- may cause drowsiness so start with just a bedtime dose or two and see how you tolerate it before trying in daytime   Add singulair 10 mg each pm until return  Take delsym two tsp every 12 hours and supplement if needed with demerol  50 mg up to 1-2 every 4 hours to suppress the urge to cough. Swallowing water or using ice  chips/non mint and menthol containing candies (such as lifesavers or sugarless jolly ranchers) are also effective.  You should rest your voice and avoid activities that you know make you cough. Once you have eliminated the cough for 3 straight days try reducing the demerol first,  then the delsym as tolerated.   GERD  Diet/   schedule sinus CT asap > neg  Please schedule a follow up office visit in 2 weeks, sooner if needed  with all medications /inhalers/ solutions in hand so we can verify exactly what you are taking. This includes all medications from all doctors and over the counters   08/22/2017  f/u ov/Makynli Stills re: asthma/ rhinitis maint symb 80 2bid/singulair min need for prns since last ov  Chief Complaint  Patient presents with  . Follow-up    Breathing is overall doing well. She has been able to start exercising some. She is using her albuterol inhaler 1 x per wk on average.   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  Remember to blow symbicort out thru nose  Plan B = Backup Only use your albuterol as a rescue medication     09/28/2018 acute extended ov/Desa Rech re: acute nasal symptoms/ chronic asthma maint on symb 80/singulair  Chief Complaint  Patient presents with  . Acute Visit    She c/o sinus pressure/HA and nasal congestion for the past wk. She uses her albuterol inhaler once per wk on average.   despite using  flonase daily now with gradually worsening nasal congestion/ pain R frontal/R ear no purulent secretions/ h1 not helping / has not returned to ENT as prev rec rec If not opening up > Prednisone 10 mg take  4 each am x 2 days,   2 each am x 2  days,  1 each am x 2 days and stop  If not better you need to see Dr Wilburn Cornelia  Work on inhaler technique:   To get the most out of exercise, you need to be continuously aware that you are short of breath      10/01/2019  f/u ov/Aldene Hendon re:  Asthma/ chronic rhinitis maint on symb 80/ singulair  Chief Complaint  Patient presents with  . Follow-up    Breathing is overall doing well. She rarely uses her rescue inhaler.  Dyspnea:  Mostly walking neighboorhood with hills moderate pace with dogs / starting step aerobics  Cough: rarely though tends to cough in am x 2 and done  Sleeping: occ wakes up chest tight resolves usually on its own, once a week SABA use: once or twice a week  02: none    No obvious day to day or daytime variability or assoc excess/ purulent sputum or mucus plugs or hemoptysis or cp or chest tightness, subjective wheeze or overt sinus or hb symptoms.    Also denies any obvious fluctuation of symptoms with weather or environmental changes or other aggravating or alleviating factors except as outlined above   No unusual exposure hx or h/o childhood pna/ asthma or knowledge of premature birth.  Current Allergies, Complete Past Medical History, Past Surgical History, Family History, and Social History were reviewed in [employer] record.  ROS  The following are not active complaints unless bolded Hoarseness, sore throat, dysphagia, dental problems, itching, sneezing,  nasal congestion or discharge of excess mucus or purulent secretions, ear ache,   fever, chills, sweats, unintended wt loss or wt gain, classically pleuritic or exertional cp,  orthopnea pnd or arm/hand swelling  or leg swelling, presyncope, palpitations, abdominal pain, anorexia, nausea, vomiting, diarrhea  or change in bowel habits or change in bladder habits, change in stools or change in urine, dysuria, hematuria,  rash, arthralgias, visual complaints, headache, numbness, weakness or ataxia  or problems with walking or coordination,  change in mood or  memory.        Current Meds  Medication Sig  . albuterol (VENTOLIN HFA) 108 (90 Base) MCG/ACT inhaler Inhale 2 puffs into the lungs every 6 (six) hours as needed for wheezing or shortness of breath.  . chlorpheniramine (CHLOR-TRIMETON) 4 MG tablet Take 4 mg by mouth at bedtime.  Marland Kitchen dextromethorphan (DELSYM) 30 MG/5ML liquid 2 tsp every 12 hours as needed for cough  . famotidine (PEPCID) 20 MG tablet TAKE 1 TABLET BY MOUTH EVERYDAY AT BEDTIME  . fexofenadine (ALLEGRA) 180 MG tablet Take 180 mg by mouth daily.  . fluticasone (  FLONASE) 50 MCG/ACT nasal spray Place 2 sprays into both nostrils daily.  . montelukast (SINGULAIR) 10 MG tablet TAKE 1 TABLET BY MOUTH EVERYDAY AT BEDTIME  . pantoprazole (PROTONIX) 40 MG tablet TAKE 1 TABLET BY MOUTH ONCE DAILY 30 TO 60 MINUTES BEFORE FIRST MEAL OF EACH DAY  . SYMBICORT 80-4.5 MCG/ACT inhaler INHALE 2 PUFFS INTO THE LUNGS TWICE A DAY            Objective:   Physical Exam   10/01/2019        199 09/28/2018     204  08/22/2017       199    04/15/2017       199   02/24/2017      193   02/11/17 199 lb (90.3 kg)  02/08/17 192 lb (87.1 kg)  11/02/16 194 lb 8 oz (88.2 kg)       BP 138/84 (BP Location: Left Arm, Cuff Size: Normal)   Pulse 77   Temp (!) 97.5 F (36.4 C) (Temporal)   Ht 5\' 7"  (1.702 m)   Wt 199 lb (90.3 kg)   SpO2 99% Comment: on RA  BMI 31.17 kg/m      amb pleasant bf nad   HEENT : pt wearing mask not removed for exam due to covid -19 concerns.    NECK :  without JVD/Nodes/TM/ nl carotid upstrokes bilaterally   LUNGS: no acc muscle use,  Nl contour chest which is clear to A and P bilaterally without cough on insp or exp maneuvers   CV:  RRR  no s3 or murmur or increase in P2, and no edema   ABD:  Obese/ soft and nontender with nl inspiratory excursion in the supine position. No bruits or organomegaly appreciated, bowel sounds nl  MS:  Nl gait/ ext warm  without deformities, calf tenderness, cyanosis or clubbing No obvious joint restrictions   SKIN: warm and dry without lesions    NEURO:  alert, approp, nl sensorium with  no motor or cerebellar deficits apparent.          Assessment:

## 2019-11-17 ENCOUNTER — Other Ambulatory Visit: Payer: Self-pay | Admitting: Internal Medicine

## 2019-11-17 DIAGNOSIS — J45991 Cough variant asthma: Secondary | ICD-10-CM

## 2020-09-30 ENCOUNTER — Ambulatory Visit: Payer: 59 | Admitting: Internal Medicine

## 2020-10-22 ENCOUNTER — Ambulatory Visit: Payer: 59 | Admitting: Internal Medicine

## 2020-12-03 ENCOUNTER — Ambulatory Visit: Payer: 59 | Admitting: Internal Medicine

## 2020-12-31 ENCOUNTER — Other Ambulatory Visit: Payer: Self-pay

## 2020-12-31 ENCOUNTER — Other Ambulatory Visit: Payer: Self-pay | Admitting: Internal Medicine

## 2020-12-31 ENCOUNTER — Telehealth: Payer: Self-pay | Admitting: Internal Medicine

## 2020-12-31 ENCOUNTER — Encounter: Payer: Self-pay | Admitting: Internal Medicine

## 2020-12-31 ENCOUNTER — Ambulatory Visit: Payer: 59 | Admitting: Internal Medicine

## 2020-12-31 DIAGNOSIS — J45991 Cough variant asthma: Secondary | ICD-10-CM | POA: Diagnosis not present

## 2020-12-31 DIAGNOSIS — J310 Chronic rhinitis: Secondary | ICD-10-CM

## 2020-12-31 MED ORDER — MOMETASONE FURO-FORMOTEROL FUM 100-5 MCG/ACT IN AERO: INHALATION_SPRAY | RESPIRATORY_TRACT | 11 refills | Status: DC

## 2020-12-31 MED ORDER — BUDESONIDE-FORMOTEROL FUMARATE 80-4.5 MCG/ACT IN AERO: 2.0000 | INHALATION_SPRAY | Freq: Two times a day (BID) | RESPIRATORY_TRACT | 11 refills | Status: DC

## 2020-12-31 NOTE — Assessment & Plan Note (Signed)
Shoemaker eval 04/20/17  - 12/31/2020 referred back to Dr Wilburn Cornelia ? Nasal polyposis ? Candidate for dupixent           Each maintenance medication was reviewed in detail including emphasizing most importantly the difference between maintenance and prns and under what circumstances the prns are to be triggered using an action plan format where appropriate.  Total time for H and P, chart review, counseling, reviewing hfa device(s) and generating customized AVS unique to this office visit / same day charting  > 30 min

## 2020-12-31 NOTE — Patient Instructions (Signed)
Dulera 100 (=symbicort 80)  Take 2 puffs first thing in am and then another 2 puffs about 12 hours later.   Follow with Dr Wilburn Cornelia    Please schedule a follow up visit in 12  months but call sooner if needed

## 2020-12-31 NOTE — Progress Notes (Signed)
Subjective:     Patient ID: Allison Wade, female   DOB: 12/11/1975  .   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 and had allergy testing neg / ent eval said needed surgery but never had it Dr Candi Leash then suddenly in Jan 2018 L sided nasal congestion then cough > rx zpak  Seemed better then dx as Flu at [facility] early Feb 2017 with cough that never resolved so returned to PCP but not better so rx ventolin no better then to ER 02/08/17 > better p prednisone p 3 rounds so referred to pulmonary clinic 02/11/2017 by Dr Christy Gentles [facility]      History of Present Illness  02/11/2017 1st [facility] office visit/ Allison Wade   Chief Complaint  Patient presents with  . Pulmonary Consult    Referred by Dr. Christy Gentles. Pt c/o cough, SOB and wheezing over the month. She occ coughs up light yellow sputum.  She states her SOB is "kind of random"- noticed more with colder weather.   finishing prednisone now but still having severe coughing fits day and night, changes in temp/ perfumes. Mostly sob during severe coughing fits rec Pantoprazole (protonix) 40 mg   Take  30-60 min before first meal of the day and Pepcid (famotidine)  20 mg one @  bedtime until return to office - this is the best way to tell whether stomach acid is contributing to your problem.   Stop allegra d and For nasal drainage / throat tickle try take CHLORPHENIRAMINE  4 mg - take one every 4 hours as needed - available over the counter- may cause drowsiness so start with just a bedtime dose or two and see how you tolerate it before trying in daytime   Add singulair 10 mg each pm until return  Take delsym two tsp every 12 hours and supplement if needed with demerol  50 mg up to 1-2 every 4 hours to suppress the urge to cough. Swallowing water or using ice  chips/non mint and menthol containing candies (such as lifesavers or sugarless jolly ranchers) are also effective.  You should rest your voice and avoid activities that you know make you cough. Once you have eliminated the cough for 3 straight days try reducing the demerol first,  then the delsym as tolerated.   GERD  Diet/   schedule sinus CT asap > neg  Please schedule a follow up office visit in 2 weeks, sooner if needed  with all medications /inhalers/ solutions in hand so we can verify exactly what you are taking. This includes all medications from all doctors and over the counters   08/22/2017  f/u ov/Allison Wade re: asthma/ rhinitis maint symb 80 2bid/singulair min need for prns since last ov  Chief Complaint  Patient presents with  . Follow-up    Breathing is overall doing well. She has been able to start exercising some. She is using her albuterol inhaler 1 x per wk on average.   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  Remember to blow symbicort out thru nose  Plan B = Backup Only use your albuterol as a rescue medication     09/28/2018 acute extended ov/Allison Wade re: acute nasal symptoms/ chronic asthma maint on symb 80/singulair  Chief Complaint  Patient presents with  . Acute Visit    She c/o sinus pressure/HA and nasal congestion for the past wk. She uses her albuterol inhaler once per wk on average.   despite using  flonase daily now with gradually worsening nasal congestion/ pain R frontal/R ear no purulent secretions/ h1 not helping / has not returned to ENT as prev rec rec If not opening up > Prednisone 10 mg take  4 each am x 2 days,   2 each am x 2  days,  1 each am x 2 days and stop  If not better you need to see Dr Wilburn Cornelia  Work on inhaler technique:   To get the most out of exercise, you need to be continuously aware that you are short of breath      10/01/2019  f/u ov/Allison Wade re:  Asthma/ chronic rhinitis maint on symb 80/ singulair  Chief Complaint  Patient presents with  . Follow-up    Breathing is overall doing well. She rarely uses her rescue inhaler.  Dyspnea:  Mostly walking neighboorhood with hills moderate pace with dogs / starting step aerobics  Cough: rarely though tends to cough in am x 2 and done  Sleeping: occ wakes up chest tight resolves usually on its own, once a week SABA use: once or twice a week  02: none  rec No change in medications.  Work on perfect  inhaler technique:    You should opt out of working other than  from home as long as possible and and only  to return under CDC workplace restrictions that 100% followed.    12/31/2020  f/u ov/Allison Wade re: asthma/ chronic rhinitis on symb 32 and singulair  Chief Complaint  Patient presents with  . Follow-up    Breathing is overall doing well. She states that she rarely uses her rescue inhaler.    Dyspnea:  Not limited by breathing from desired activities   Cough: some with ex  Sleeping: no problem SABA use: rarely  02: none  Lots of nasal congestion, L side never opens compltely     No obvious day to day or daytime variability or assoc excess/ purulent sputum or mucus plugs or hemoptysis or cp or chest tightness, subjective wheeze or overt   hb symptoms.   Sleeping  without nocturnal  or early am exacerbation  of respiratory  c/o's or need for noct saba. Also denies any obvious fluctuation of symptoms with weather or environmental changes or other aggravating or alleviating factors except as outlined above   No unusual exposure hx or h/o childhood pna/ asthma or knowledge of premature birth.  Current Allergies, Complete Past Medical History, Past Surgical  History, Family History, and Social History were reviewed in [employer] record.  ROS  The following are not active complaints unless bolded Hoarseness, sore throat, dysphagia, dental problems, itching, sneezing,  nasal congestion or discharge of excess mucus or purulent secretions, ear ache,   fever, chills, sweats, unintended wt loss or wt gain, classically pleuritic or exertional cp,  orthopnea pnd or arm/hand swelling  or leg swelling, presyncope, palpitations, abdominal pain, anorexia, nausea, vomiting, diarrhea  or change in bowel habits or change in bladder habits, change in stools or change in urine, dysuria, hematuria,  rash, arthralgias, visual complaints, headache, numbness, weakness or ataxia or problems with walking or coordination,  change in mood or  memory.        Current Meds  Medication Sig  . albuterol (VENTOLIN HFA) 108 (90 Base) MCG/ACT inhaler Inhale 2 puffs into the lungs every 6 (six) hours as needed for wheezing or shortness of breath.  . chlorpheniramine (CHLOR-TRIMETON) 4 MG tablet Take 4 mg by mouth at bedtime.  Marland Kitchen dextromethorphan (DELSYM) 30 MG/5ML liquid 2 tsp every 12 hours as needed for cough  . famotidine (PEPCID) 20 MG tablet TAKE 1 TABLET BY MOUTH EVERYDAY AT BEDTIME  . fexofenadine (ALLEGRA) 180 MG tablet Take 180 mg by mouth daily.  . fluticasone (FLONASE) 50 MCG/ACT nasal spray Place 2 sprays into both nostrils daily.  . montelukast (SINGULAIR) 10 MG tablet TAKE 1 TABLET BY MOUTH EVERYDAY AT BEDTIME  . pantoprazole (PROTONIX) 40 MG tablet TAKE 1 TABLET BY MOUTH ONCE DAILY 30 TO 60 MINUTES BEFORE FIRST MEAL OF EACH DAY  . SYMBICORT 80-4.5 MCG/ACT inhaler INHALE 2 PUFFS INTO THE LUNGS TWICE A DAY                  Objective:   Physical Exam   12/31/2020         206  10/01/2019        199 09/28/2018     204  08/22/2017       199    04/15/2017       199   02/24/2017      193   02/11/17 199 lb (90.3 kg)  02/08/17 192 lb (87.1 kg)   11/02/16 194 lb 8 oz (88.2 kg)      Vital signs reviewed  12/31/2020  - Note at rest 02 sats  98% on RA   General appearance:    amb bf nad  slt nasal tone to voice    HEENT : pt wearing mask not removed for exam due to covid -19 concerns.    NECK :  without JVD/Nodes/TM/ nl carotid upstrokes bilaterally   LUNGS: no acc muscle use,  Nl contour chest which is clear to A and P bilaterally without cough on insp or exp maneuvers   CV:  RRR  no s3 or murmur or increase in P2, and no edema   ABD:  soft and nontender with nl inspiratory excursion in the supine position. No bruits or organomegaly appreciated, bowel sounds nl  MS:  Nl gait/ ext warm without deformities, calf tenderness, cyanosis or clubbing No obvious joint restrictions   SKIN: warm and dry without lesions    NEURO:  alert, approp, nl sensorium with  no motor or cerebellar deficits apparent.                   Assessment:

## 2020-12-31 NOTE — Telephone Encounter (Signed)
Spoke with the pt  Allison Wade is not covered by her insurance  Per notes from visit 12/31/20 can go ahead and send in rx for the Symbicort 80  This was sent in to pharm and nothing further needed

## 2020-12-31 NOTE — Assessment & Plan Note (Addendum)
Never smoker Spirometry 02/11/2017  Completely nl - FENO 02/11/2017  =  27 on pred - Trial of singulair 02/11/2017 >>>   - Max gerd rx 02/11/2017 >>>  - Sinus CT 02/11/2017   wnl  - FENO 02/24/2017  =   81 off pred since 02/18/17  - Spirometry 02/24/2017  wnl during flare with last saba x 12 h prior  - FENO 02/24/2017  =     81 off ICS / on singulair  02/24/2017   start symbicort 80 2bid 02/2017 >  Eos  0.1/ RAST +cat/dog/cockroach, IgE 86.  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  Contingencies discussed in full including contacting this office immediately if not controlling the symptoms using the rule of two's.     Formulary change needed from symb 80 so rec try dulera 100 2bid  Advised:  formulary restrictions will be an ongoing challenge for the forseable future and I would be happy to pick an alternative if the pt will first  provide me a list of them -  pt  will need to return here for training for any new device that is required eg dpi vs hfa vs respimat.    In the meantime we can always provide samples so that the patient never runs out of any needed respiratory medications.

## 2021-12-31 ENCOUNTER — Ambulatory Visit: Payer: 59 | Admitting: Internal Medicine

## 2022-01-07 ENCOUNTER — Ambulatory Visit: Payer: 59 | Admitting: Internal Medicine

## 2022-02-03 ENCOUNTER — Ambulatory Visit: Payer: 59 | Admitting: Internal Medicine

## 2022-02-24 ENCOUNTER — Other Ambulatory Visit: Payer: Self-pay

## 2022-02-24 ENCOUNTER — Other Ambulatory Visit: Payer: Self-pay | Admitting: Internal Medicine

## 2022-02-24 ENCOUNTER — Telehealth (INDEPENDENT_AMBULATORY_CARE_PROVIDER_SITE_OTHER): Payer: 59 | Admitting: Internal Medicine

## 2022-02-24 ENCOUNTER — Encounter: Payer: Self-pay | Admitting: Internal Medicine

## 2022-02-24 DIAGNOSIS — J310 Chronic rhinitis: Secondary | ICD-10-CM | POA: Diagnosis not present

## 2022-02-24 DIAGNOSIS — J45991 Cough variant asthma: Secondary | ICD-10-CM

## 2022-02-24 MED ORDER — PREDNISONE 10 MG PO TABS: ORAL_TABLET | ORAL | 0 refills | Status: DC

## 2022-02-24 MED ORDER — AMOXICILLIN-POT CLAVULANATE 875-125 MG PO TABS: 1.0000 | ORAL_TABLET | Freq: Two times a day (BID) | ORAL | 0 refills | Status: AC

## 2022-02-24 NOTE — Progress Notes (Signed)
Subjective:  ?  ? Patient ID: Allison Wade, female   DOB: 1975-12-15  .   MRN: [id number] ? ?  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 ? ? ?History of Present Illness  ?02/11/2017 1st [facility] office visit/ Darry Kelnhofer   ?Chief Complaint  ?Patient presents with  ? Pulmonary Consult  ?  Referred by Dr. Christy Gentles. Pt c/o cough, SOB and wheezing over the month. She occ coughs up light yellow sputum.  She states her SOB is "kind of random"- noticed more with colder weather.   ?finishing prednisone now but still having severe coughing fits day and night, changes in temp/ perfumes. ?Mostly sob during severe coughing fits ?rec ?Pantoprazole (protonix) 40 mg   Take  30-60 min before first meal of the day and Pepcid (famotidine)  20 mg one @  bedtime until return to office - this is the best way to tell whether stomach acid is contributing to your problem.   ?Stop allegra d and For nasal drainage / throat tickle try take CHLORPHENIRAMINE  4 mg - take one every 4 hours as needed - available over the counter- may cause drowsiness so start with just a bedtime dose or two and see how you tolerate it before trying in daytime   ?Add singulair 10 mg each pm until return  ?Take delsym two tsp every 12 hours and supplement if needed with demerol  50 mg up to 1-2 every 4 hours to suppress the urge to cough. Swallowing water or using ice  chips/non mint and menthol containing candies (such as lifesavers or sugarless jolly ranchers) are also effective.  You should rest your voice and avoid activities that you know make you cough. ?Once you have eliminated the cough for 3 straight days try reducing the demerol first,  then the delsym as tolerated.   ?GERD  Diet/  ? schedule sinus CT asap > neg  ?Please schedule a follow up office visit in 2 weeks, sooner if needed  with all medications /inhalers/ solutions in hand so we can verify exactly what you are taking. This includes all medications from all doctors and over the counters ? ? ?08/22/2017  f/u ov/Boston Cookson re: asthma/ rhinitis maint symb 80 2bid/singulair min need for prns since last ov  ?Chief Complaint  ?Patient presents with  ? Follow-up  ?  Breathing is overall doing well. She has been able to start exercising some. She is using her albuterol inhaler 1 x per wk on average.   ?rare saba/ sleeping fine s h1  ?Feels blocked L nose already established with shoemaker/ no real variability/ min improved with flonase but does not blow symb out thru nose as rec  ?Has not started aerobics but Not limited by breathing from desired activities   ?rec ?Plan A = Automatic = symbicort  80 Take 2 puffs first thing in am and then another 2 puffs about 12 hours later.  ?Remember to blow symbicort out thru nose  ?Plan B = Backup ?Only use your albuterol as a rescue medication  ? ? ? ?09/28/2018 acute extended ov/Danaya Geddis re: acute nasal symptoms/ chronic asthma maint on symb 80/singulair  ?Chief Complaint  ?Patient presents with  ? Acute Visit  ?  She c/o sinus pressure/HA and nasal congestion for the past wk. She uses her albuterol inhaler once per wk on average.   ?despite using  flonase daily now with gradually worsening nasal congestion/ pain R frontal/R ear no purulent secretions/ h1 not helping / has not returned to ENT as prev rec ?rec ?If not opening up > Prednisone 10 mg take  4 each am x 2 days,   2 each am x 2  days,  1 each am x 2 days and stop  ?If not better you need to see Dr Wilburn Cornelia  ?Work on inhaler technique:   ?To get the most out of exercise, you need to be continuously aware that you are short of breath ? ?  ?  ?10/01/2019  f/u ov/Veronda Gabor re:  Asthma/ chronic rhinitis maint on symb 80/ singulair  ?Chief Complaint  ?Patient presents with  ? Follow-up  ?  Breathing is overall doing well. She rarely uses her rescue inhaler.  ?Dyspnea:  Mostly walking neighboorhood with hills moderate pace with dogs / starting step aerobics  ?Cough: rarely though tends to cough in am x 2 and done  ?Sleeping: occ wakes up chest tight resolves usually on its own, once a week ?SABA use: once or twice a week  ?02: none  ?rec ?No change in medications.  ?Work on perfect  inhaler technique:    ?You should opt out of working other than  from home as long as possible and and only  to return under CDC workplace restrictions that 100% followed. ? ? ? ?12/31/2020  f/u ov/Dallon Dacosta re: asthma/ chronic rhinitis on symb 80 and singulair  ?Chief Complaint  ?Patient presents with  ? Follow-up  ?  Breathing is overall doing well. She states that she rarely uses her rescue inhaler.   ? Dyspnea:  Not limited by breathing from desired activities   ?Cough: some with ex  ?Sleeping: no problem ?SABA use: rarely  ?02: none  ?Lots of nasal congestion, L side never opens compltely   ?Rec ?Dulera 100 (=symbicort 80)  Take 2 puffs first thing in am and then another 2 puffs about 12 hours later.  ?Follow with Dr Wilburn Cornelia > not done  ?Please schedule a follow up visit in 12  months but call sooner if needed  ? ? ? ?02/24/2022  acute televist:  Virtual Visit via Mychart audio/viz connection Note 02/24/2022  ? ?I connected with Cheyna L Harbison on 02/24/22 at 10:30 AM EDT by telephone and verified that I am speaking with the correct person using two identifiers. Pt is at home and this call made from my office with no other participants  ?  ?I discussed the limitations,  risks, security and privacy concerns of performing an evaluation and management service virtually  and the availability of in person appointments. I also discussed with the patient that there may be a patient responsible charge related to this service. The patient expressed understanding and agreed to proceed. ? ?  ?  ? ?Current Meds  ?Medication Sig  ? albuterol (VENTOLIN HFA) 108 (90 Base) MCG/ACT  inhaler Inhale 2 puffs into the lungs every 6 (six) hours as needed for wheezing or shortness of breath.  ? amoxicillin-clavulanate (AUGMENTIN) 875-125 MG tablet Take 1 tablet by mouth 2 (two) times daily for 10 days.  ? budesonide-formoterol (SYMBICORT) 80-4.5 MCG/ACT inhaler Inhale 2 puffs into the lungs in the morning and at bedtime.  ? chlorpheniramine (CHLOR-TRIMETON) 4 MG tablet Take 4 mg by mouth at bedtime.  ? Cholecalciferol (VITAMIN D3) 1.25 MG (50000 UT) TABS Take 1 tablet by mouth daily at 6 (six) AM.  ? dextromethorphan (DELSYM) 30 MG/5ML liquid 2 tsp every 12 hours as needed for cough  ? famotidine (PEPCID) 20 MG tablet TAKE 1 TABLET BY MOUTH EVERYDAY AT BEDTIME  ? fexofenadine (ALLEGRA) 180 MG tablet Take 180 mg by mouth daily.  ? fluticasone (FLONASE) 50 MCG/ACT nasal spray Place 2 sprays into both nostrils daily.  ? pantoprazole (PROTONIX) 40 MG tablet TAKE 1 TABLET BY MOUTH ONCE DAILY 30 TO 60 MINUTES BEFORE FIRST MEAL OF EACH DAY  ? predniSONE (DELTASONE) 10 MG tablet Take  4 each am x 2 days,   2 each am x 2 days,  1 each am x 2 days and stop  ?    ?  ? ov/Kelena Garrow re: asthma/rhinitis  maint on symbicort 80 2bid and doing well until 02/22/22 onset nasal congestion  / thick yellow / ST no fever assoc eyes  itchy ?Chief Complaint  ?Patient presents with  ? Acute Visit  ?Dyspnea:  not much change  ?Cough: mostly dry  ?Sleeping: poorly for one night  ?SABA use: x one extra  ?02: none  ?Covid status:   vax x 3  ? ? ?No obvious day to day or daytime variability or assoc excess/ purulent sputum or mucus plugs or  hemoptysis or cp or chest tightness, subjective wheeze or   hb symptoms.  ? ? Also denies any obvious fluctuation of symptoms with weather or environmental changes or other aggravating or alleviating factors e

## 2022-02-24 NOTE — Assessment & Plan Note (Signed)
Shoemaker eval 04/20/17  ?- 12/31/2020 referred back to Dr Wilburn Cornelia ? Nasal polyposis ? Candidate for dupixent > rec return p flare 02/24/2022  ? ?Acute flare in setting of uri > rx augmentin x 10 days and f/u with Dr Wilburn Cornelia. ? ?    ?  ? ?Each maintenance medication was reviewed in detail including emphasizing most importantly the difference between maintenance and prns and under what circumstances the prns are to be triggered using an action plan format where appropriate. ? ?Total time for H and P, chart review, counseling, reviewing hfa  device(s) and generating customized AVS unique to this office visit / same day charting = 25 min  ?     ?

## 2022-02-24 NOTE — Assessment & Plan Note (Signed)
Never smoker ?Spirometry 02/11/2017  Completely nl ?- FENO 02/11/2017  =  27 on pred ?- Trial of singulair 02/11/2017 >>>   ?- Max gerd rx 02/11/2017 >>>  ?- Sinus CT 02/11/2017   wnl  ?- FENO 02/24/2017  =   81 off pred since 02/18/17  ?- Spirometry 02/24/2017  wnl during flare with last saba x 12 h prior  ?- FENO 02/24/2017  =     81 off ICS / on singulair  ?02/24/2017   start symbicort 80 2bid ?02/2017 >  Eos  0.1/ RAST +cat/dog/cockroach, IgE 86.  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 ? ?Despite uri/ ? Sinusitis pt well compensated and min need for saba on maint rx as is so no change rx needed  ?

## 2022-02-24 NOTE — Patient Instructions (Addendum)
I emphasized that nasal steroids have no immediate benefit in terms of improving symptoms.  To help them reached the target tissue, the patient should use Afrin two puffs every 12 hours applied one min before using the nasal steroids.  Afrin should be stopped after no more than 5 days.  If the symptoms worsen, Afrin can be restarted after 3-5 days off of therapy to prevent rebound congestion from overuse of Afrin.  I also emphasized that in no way are nasal steroids a concern in terms of "addiction".  ? ?For cough/ congestion >  mucinex D twice daily and stop chlorpheniramine  ? ?Augmentin 875 mg take one pill twice daily  X 10 days - take at breakfast and supper with large glass of water.  It would help reduce the usual side effects (diarrhea and yeast infections) if you ate cultured yogurt at lunch.  ? ?Prednisone 10 mg take  4 each am x 2 days,   2 each am x 2 days,  1 each am x 2 days and stop  ? ?Make an appt to see Wilburn Cornelia at your earliest convenience  ? ?Keep the candy handy - no mint menthol or chocolate  ? ?Please schedule a follow up visit in 3 months but call sooner if needed - bring inhalers with you  ? ? ? ? ? ? ?

## 2022-03-01 ENCOUNTER — Other Ambulatory Visit: Payer: Self-pay | Admitting: Internal Medicine

## 2022-03-08 ENCOUNTER — Telehealth: Payer: Self-pay | Admitting: Internal Medicine

## 2022-03-08 NOTE — Telephone Encounter (Signed)
Called and spoke with patient. She stated that she [facility] saying that the symbicort wasn't approved but someone from our facility had told her that it was sent and approved by the doctor. Patient verified her pharmacy. ? ?I advised patient to give [facility] a call to ask them about it and to call us back if [facility] still says that it hasn't been approved by the doctor.  ?

## 2022-05-13 ENCOUNTER — Other Ambulatory Visit: Payer: Self-pay | Admitting: Otolaryngology

## 2022-05-20 ENCOUNTER — Other Ambulatory Visit: Payer: Self-pay

## 2022-05-20 ENCOUNTER — Encounter (HOSPITAL_COMMUNITY): Payer: Self-pay | Admitting: Otolaryngology

## 2022-05-20 NOTE — Progress Notes (Signed)
PCP - Benito Mccreedy, MD Cardiologist - pt denies  EKG - DOS Chest x-ray -  ECHO -  Cardiac Cath -   ERAS Protcol - 0715 COVID TEST-   Anesthesia review: n/a  -------------  SDW INSTRUCTIONS:  Your procedure is scheduled on Friday 6/23 . Please report to [facility] Main Entrance "A" at 7:50 A.M., and check in at the Admitting office. Call this number if you have problems the morning of surgery: [phone]   Remember: Do not eat after midnight the night before your surgery  You may drink clear liquids until 07:15 AM the morning of your surgery.   Clear liquids allowed are: Water, Non-Citrus Juices (without pulp), Carbonated Beverages, Clear Tea, Black Coffee Only, and Gatorade   Medications to take morning of surgery with a sip of water include: albuterol (VENTOLIN HFA) if needed SYMBICORT if needed fexofenadine (ALLEGRA) if needed fluticasone (FLONASE) if needed pantoprazole (PROTONIX) if needed  As of today, STOP taking any Aspirin (unless otherwise instructed by your surgeon), Aleve, Naproxen, Ibuprofen, Motrin, Advil, Goody's, BC's, all herbal medications, fish oil, and all vitamins.    The Morning of Surgery Do not wear jewelry, make-up or nail polish. Do not wear lotions, powders, or perfumes, or deodorant Do not bring valuables to the hospital. [facility] is not responsible for any belongings or valuables.  If you are a smoker, DO NOT Smoke 24 hours prior to surgery  If you wear a CPAP at night please bring your mask the morning of surgery   Remember that you must have someone to transport you home after your surgery, and remain with you for 24 hours if you are discharged the same day.  Please bring cases for contacts, glasses, hearing aids, dentures or bridgework because it cannot be worn into surgery.   Patients discharged the day of surgery will not be allowed to drive home.   Please shower the NIGHT BEFORE/MORNING OF SURGERY (use antibacterial soap  like DIAL soap if possible). Wear comfortable clothes the morning of surgery. Oral Hygiene is also important to reduce your risk of infection.  Remember - BRUSH YOUR TEETH THE MORNING OF SURGERY WITH YOUR REGULAR TOOTHPASTE  Patient denies shortness of breath, fever, cough and chest pain.

## 2022-05-21 ENCOUNTER — Encounter (HOSPITAL_COMMUNITY): Payer: Self-pay | Admitting: Otolaryngology

## 2022-05-21 ENCOUNTER — Ambulatory Visit (HOSPITAL_COMMUNITY): Payer: 59 | Admitting: Certified Registered Nurse Anesthetist

## 2022-05-21 ENCOUNTER — Encounter (HOSPITAL_COMMUNITY)
Admission: RE | Disposition: A | Discharge status: Home / Self Care | Payer: Self-pay | Source: Ambulatory Visit | Attending: Otolaryngology

## 2022-05-21 ENCOUNTER — Ambulatory Visit (HOSPITAL_COMMUNITY)
Admission: RE | Admit: 2022-05-21 | Discharge: 2022-05-21 | Disposition: A | Discharge status: Home / Self Care | Payer: 59 | Source: Ambulatory Visit | Attending: Otolaryngology | Admitting: Otolaryngology

## 2022-05-21 ENCOUNTER — Ambulatory Visit (HOSPITAL_BASED_OUTPATIENT_CLINIC_OR_DEPARTMENT_OTHER): Payer: 59 | Admitting: Certified Registered Nurse Anesthetist

## 2022-05-21 ENCOUNTER — Other Ambulatory Visit: Payer: Self-pay

## 2022-05-21 DIAGNOSIS — R519 Headache, unspecified: Secondary | ICD-10-CM | POA: Diagnosis not present

## 2022-05-21 DIAGNOSIS — I119 Hypertensive heart disease without heart failure: Secondary | ICD-10-CM | POA: Insufficient documentation

## 2022-05-21 DIAGNOSIS — J988 Other specified respiratory disorders: Secondary | ICD-10-CM | POA: Insufficient documentation

## 2022-05-21 DIAGNOSIS — J45909 Unspecified asthma, uncomplicated: Secondary | ICD-10-CM | POA: Diagnosis not present

## 2022-05-21 DIAGNOSIS — J310 Chronic rhinitis: Secondary | ICD-10-CM | POA: Diagnosis not present

## 2022-05-21 DIAGNOSIS — J343 Hypertrophy of nasal turbinates: Secondary | ICD-10-CM | POA: Diagnosis not present

## 2022-05-21 DIAGNOSIS — D649 Anemia, unspecified: Secondary | ICD-10-CM | POA: Diagnosis not present

## 2022-05-21 DIAGNOSIS — J3489 Other specified disorders of nose and nasal sinuses: Secondary | ICD-10-CM | POA: Diagnosis not present

## 2022-05-21 DIAGNOSIS — K219 Gastro-esophageal reflux disease without esophagitis: Secondary | ICD-10-CM | POA: Insufficient documentation

## 2022-05-21 DIAGNOSIS — F419 Anxiety disorder, unspecified: Secondary | ICD-10-CM | POA: Diagnosis not present

## 2022-05-21 DIAGNOSIS — J342 Deviated nasal septum: Secondary | ICD-10-CM

## 2022-05-21 HISTORY — PX: NASAL SEPTOPLASTY W/ TURBINOPLASTY: SHX2070

## 2022-05-21 HISTORY — DX: Unspecified asthma, uncomplicated: J45.909

## 2022-05-21 LAB — CBC
HCT: 38.3 % (ref 36.0–46.0)
Hemoglobin: 13.4 g/dL (ref 12.0–15.0)
MCH: 30.2 pg (ref 26.0–34.0)
MCHC: 35 g/dL (ref 30.0–36.0)
MCV: 86.3 fL (ref 80.0–100.0)
Platelets: 275 10*3/uL (ref 150–400)
RBC: 4.44 MIL/uL (ref 3.87–5.11)
RDW: 11.9 % (ref 11.5–15.5)
WBC: 10.5 10*3/uL (ref 4.0–10.5)
nRBC: 0 % (ref 0.0–0.2)

## 2022-05-21 LAB — BASIC METABOLIC PANEL
Anion gap: 6 (ref 5–15)
BUN: 10 mg/dL (ref 6–20)
CO2: 26 mmol/L (ref 22–32)
Calcium: 9.4 mg/dL (ref 8.9–10.3)
Chloride: 106 mmol/L (ref 98–111)
Creatinine, Ser: 0.75 mg/dL (ref 0.44–1.00)
GFR, Estimated: >60 mL/min (ref >60)
Glucose, Bld: 107 mg/dL — ABNORMAL HIGH (ref 70–99)
Potassium: 3.6 mmol/L (ref 3.5–5.1)
Sodium: 138 mmol/L (ref 135–145)

## 2022-05-21 SURGERY — SEPTOPLASTY, NOSE, WITH NASAL TURBINATE REDUCTION
Anesthesia: General | Site: Nose | Laterality: Bilateral

## 2022-05-21 MED ORDER — MUPIROCIN 2 % EX OINT
TOPICAL_OINTMENT | CUTANEOUS | Status: AC
  Filled 2022-05-21: qty 22

## 2022-05-21 MED ORDER — DIPHENHYDRAMINE HCL 50 MG/ML IJ SOLN
INTRAMUSCULAR | Status: AC
Start: 2022-05-21 — End: ?
  Filled 2022-05-21: qty 1

## 2022-05-21 MED ORDER — PHENYLEPHRINE 80 MCG/ML (10ML) SYRINGE FOR IV PUSH (FOR BLOOD PRESSURE SUPPORT)
PREFILLED_SYRINGE | INTRAVENOUS | Status: DC | PRN
  Administered 2022-05-21: 80 ug via INTRAVENOUS
  Administered 2022-05-21 (×3): 160 ug via INTRAVENOUS
  Administered 2022-05-21: 80 ug via INTRAVENOUS

## 2022-05-21 MED ORDER — LACTATED RINGERS IV SOLN: INTRAVENOUS | Status: DC

## 2022-05-21 MED ORDER — PROPOFOL 10 MG/ML IV BOLUS
INTRAVENOUS | Status: DC | PRN
  Administered 2022-05-21: 200 mg via INTRAVENOUS
  Administered 2022-05-21: 100 mg via INTRAVENOUS
  Administered 2022-05-21: 30 mg via INTRAVENOUS

## 2022-05-21 MED ORDER — AMISULPRIDE (ANTIEMETIC) 5 MG/2ML IV SOLN
10.0000 mg | Freq: Once | INTRAVENOUS | Status: DC | PRN
Start: 2022-05-21 — End: 2022-05-21

## 2022-05-21 MED ORDER — ONDANSETRON HCL 4 MG/2ML IJ SOLN
INTRAMUSCULAR | Status: DC | PRN
  Administered 2022-05-21: 4 mg via INTRAVENOUS

## 2022-05-21 MED ORDER — PROPOFOL 10 MG/ML IV BOLUS
INTRAVENOUS | Status: AC
Start: 2022-05-21 — End: ?
  Filled 2022-05-21: qty 20

## 2022-05-21 MED ORDER — LEVOFLOXACIN 500 MG PO TABS: ORAL_TABLET | ORAL | 0 refills | Status: AC

## 2022-05-21 MED ORDER — MUPIROCIN 2 % EX OINT
TOPICAL_OINTMENT | CUTANEOUS | Status: DC | PRN
  Administered 2022-05-21: 1 via NASAL

## 2022-05-21 MED ORDER — LIDOCAINE-EPINEPHRINE 2 %-1:100000 IJ SOLN
INTRAMUSCULAR | Status: AC
  Filled 2022-05-21: qty 1

## 2022-05-21 MED ORDER — ORAL CARE MOUTH RINSE: 15.0000 mL | Freq: Once | OROMUCOSAL | Status: AC

## 2022-05-21 MED ORDER — CHLORHEXIDINE GLUCONATE 0.12 % MT SOLN
15.0000 mL | Freq: Once | OROMUCOSAL | Status: AC
  Administered 2022-05-21: 15 mL via OROMUCOSAL
  Filled 2022-05-21: qty 15

## 2022-05-21 MED ORDER — SCOPOLAMINE 1 MG/3DAYS TD PT72
MEDICATED_PATCH | TRANSDERMAL | Status: DC | PRN
  Administered 2022-05-21: 1 via TRANSDERMAL

## 2022-05-21 MED ORDER — PROPOFOL 10 MG/ML IV BOLUS
INTRAVENOUS | Status: AC
  Filled 2022-05-21: qty 20

## 2022-05-21 MED ORDER — SUGAMMADEX SODIUM 200 MG/2ML IV SOLN
INTRAVENOUS | Status: DC | PRN
  Administered 2022-05-21: 200 mg via INTRAVENOUS

## 2022-05-21 MED ORDER — 0.9 % SODIUM CHLORIDE (POUR BTL) OPTIME
TOPICAL | Status: DC | PRN
  Administered 2022-05-21: 1000 mL

## 2022-05-21 MED ORDER — OXYMETAZOLINE HCL 0.05 % NA SOLN
NASAL | Status: DC | PRN
  Administered 2022-05-21: 1 via TOPICAL

## 2022-05-21 MED ORDER — LIDOCAINE-EPINEPHRINE 1 %-1:100000 IJ SOLN
INTRAMUSCULAR | Status: AC
  Filled 2022-05-21: qty 1

## 2022-05-21 MED ORDER — LIDOCAINE 2% (20 MG/ML) 5 ML SYRINGE
INTRAMUSCULAR | Status: DC | PRN
  Administered 2022-05-21: 60 mg via INTRAVENOUS

## 2022-05-21 MED ORDER — FENTANYL CITRATE (PF) 250 MCG/5ML IJ SOLN
INTRAMUSCULAR | Status: DC | PRN
  Administered 2022-05-21: 150 ug via INTRAVENOUS

## 2022-05-21 MED ORDER — SCOPOLAMINE 1 MG/3DAYS TD PT72
MEDICATED_PATCH | TRANSDERMAL | Status: AC
  Filled 2022-05-21: qty 1

## 2022-05-21 MED ORDER — CIPROFLOXACIN IN D5W 400 MG/200ML IV SOLN
400.0000 mg | Freq: Once | INTRAVENOUS | Status: AC
  Administered 2022-05-21: 400 mg via INTRAVENOUS
  Filled 2022-05-21: qty 200

## 2022-05-21 MED ORDER — MIDAZOLAM HCL 2 MG/2ML IJ SOLN
INTRAMUSCULAR | Status: DC | PRN
  Administered 2022-05-21: 2 mg via INTRAVENOUS

## 2022-05-21 MED ORDER — MIDAZOLAM HCL 2 MG/2ML IJ SOLN
INTRAMUSCULAR | Status: AC
  Filled 2022-05-21: qty 2

## 2022-05-21 MED ORDER — FENTANYL CITRATE (PF) 100 MCG/2ML IJ SOLN
INTRAMUSCULAR | Status: AC
  Filled 2022-05-21: qty 2

## 2022-05-21 MED ORDER — FENTANYL CITRATE (PF) 250 MCG/5ML IJ SOLN
INTRAMUSCULAR | Status: AC
  Filled 2022-05-21: qty 5

## 2022-05-21 MED ORDER — DEXMEDETOMIDINE (PRECEDEX) IN NS 20 MCG/5ML (4 MCG/ML) IV SYRINGE
PREFILLED_SYRINGE | INTRAVENOUS | Status: DC | PRN
  Administered 2022-05-21: 12 ug via INTRAVENOUS
  Administered 2022-05-21: 8 ug via INTRAVENOUS

## 2022-05-21 MED ORDER — ACETAMINOPHEN 10 MG/ML IV SOLN
INTRAVENOUS | Status: AC
  Filled 2022-05-21: qty 100

## 2022-05-21 MED ORDER — LIDOCAINE-EPINEPHRINE 1 %-1:100000 IJ SOLN
INTRAMUSCULAR | Status: DC | PRN
  Administered 2022-05-21: 6 mL

## 2022-05-21 MED ORDER — ROCURONIUM BROMIDE 10 MG/ML (PF) SYRINGE
PREFILLED_SYRINGE | INTRAVENOUS | Status: DC | PRN
  Administered 2022-05-21: 60 mg via INTRAVENOUS

## 2022-05-21 MED ORDER — DEXAMETHASONE SODIUM PHOSPHATE 10 MG/ML IJ SOLN
INTRAMUSCULAR | Status: DC | PRN
  Administered 2022-05-21: 10 mg via INTRAVENOUS

## 2022-05-21 MED ORDER — HYDRALAZINE HCL 20 MG/ML IJ SOLN: 5.0000 mg | Freq: Once | INTRAMUSCULAR | Status: AC

## 2022-05-21 MED ORDER — HYDRALAZINE HCL 20 MG/ML IJ SOLN
INTRAMUSCULAR | Status: AC
  Administered 2022-05-21: 5 mg via INTRAVENOUS
  Filled 2022-05-21: qty 1

## 2022-05-21 MED ORDER — FENTANYL CITRATE (PF) 100 MCG/2ML IJ SOLN
25.0000 ug | INTRAMUSCULAR | Status: DC | PRN
  Administered 2022-05-21: 50 ug via INTRAVENOUS

## 2022-05-21 MED ORDER — ACETAMINOPHEN 10 MG/ML IV SOLN
1000.0000 mg | Freq: Once | INTRAVENOUS | Status: DC | PRN
  Administered 2022-05-21: 1000 mg via INTRAVENOUS

## 2022-05-21 MED ORDER — DIPHENHYDRAMINE HCL 50 MG/ML IJ SOLN
INTRAMUSCULAR | Status: DC | PRN
  Administered 2022-05-21: 12.5 mg via INTRAVENOUS

## 2022-05-21 MED ORDER — OXYMETAZOLINE HCL 0.05 % NA SOLN
NASAL | Status: AC
  Filled 2022-05-21: qty 30

## 2022-05-21 SURGICAL SUPPLY — 27 items
BAG COUNTER SPONGE SURGICOUNT (BAG) ×2 IMPLANT
CANISTER SUCT 3000ML PPV (MISCELLANEOUS) ×2 IMPLANT
COAGULATOR SUCT SWTCH 10FR 6 (ELECTROSURGICAL) IMPLANT
DRAPE HALF SHEET 40X57 (DRAPES) IMPLANT
ELECT REM PT RETURN 9FT ADLT (ELECTROSURGICAL)
ELECTRODE REM PT RTRN 9FT ADLT (ELECTROSURGICAL) IMPLANT
GAUZE 4X4 16PLY ~~LOC~~+RFID DBL (SPONGE) ×1 IMPLANT
GAUZE SPONGE 2X2 8PLY STRL LF (GAUZE/BANDAGES/DRESSINGS) ×1 IMPLANT
GLOVE BIOGEL M 7.0 STRL (GLOVE) ×4 IMPLANT
GOWN STRL REUS W/ TWL LRG LVL3 (GOWN DISPOSABLE) ×2 IMPLANT
GOWN STRL REUS W/TWL LRG LVL3 (GOWN DISPOSABLE) ×2
KIT BASIN OR (CUSTOM PROCEDURE TRAY) ×2 IMPLANT
KIT TURNOVER KIT B (KITS) ×2 IMPLANT
NDL HYPO 25GX1X1/2 BEV (NEEDLE) ×1 IMPLANT
NEEDLE HYPO 25GX1X1/2 BEV (NEEDLE) IMPLANT
NS IRRIG 1000ML POUR BTL (IV SOLUTION) ×2 IMPLANT
PAD ARMBOARD 7.5X6 YLW CONV (MISCELLANEOUS) ×2 IMPLANT
SPLINT NASAL AIRWAY SILICONE (MISCELLANEOUS) ×1 IMPLANT
SPLINT NASAL DOYLE BI-VL (GAUZE/BANDAGES/DRESSINGS) ×2 IMPLANT
SPONGE GAUZE 2X2 STER 10/PKG (GAUZE/BANDAGES/DRESSINGS)
SPONGE NEURO XRAY DETECT 1X3 (DISPOSABLE) ×2 IMPLANT
SUT ETHILON 3 0 PS 1 (SUTURE) ×2 IMPLANT
SUT PLAIN 4 0 ~~LOC~~ 1 (SUTURE) ×2 IMPLANT
TOWEL GREEN STERILE FF (TOWEL DISPOSABLE) ×2 IMPLANT
TRAY ENT MC OR (CUSTOM PROCEDURE TRAY) ×2 IMPLANT
TUBE SALEM SUMP 16 FR W/ARV (TUBING) ×2 IMPLANT
TUBING EXTENTION W/L.L. (IV SETS) ×2 IMPLANT

## 2022-05-22 ENCOUNTER — Encounter (HOSPITAL_COMMUNITY): Payer: Self-pay | Admitting: Otolaryngology

## 2022-05-26 NOTE — Progress Notes (Deleted)
Subjective:     Patient ID: Allison Wade, female   DOB: 1976-06-09  .   MRN: [id number]    Brief patient profile:  18 yobf never smoker grew up in [place] with summer / fall itching sneezing never affected breathing able to do track ok but gradually as adult also had christmas trees bronchitis and had allergy testing neg / ent eval said needed surgery but never had it Dr Candi Leash then suddenly in Jan 2018 L sided nasal congestion then cough > rx zpak  Seemed better then [facility] with cough that never resolved so returned to PCP but not better so rx ventolin no better then to ER 02/08/17 > better p prednisone p 3 rounds so referred to pulmonary clinic 02/11/2017 by Dr Christy Gentles [facility] ER      History of Present Illness  02/11/2017 1st [facility] office visit/ Riyaan Heroux   Chief Complaint  Patient presents with   Pulmonary Consult    Referred by Dr. Christy Gentles. Pt c/o cough, SOB and wheezing over the month. She occ coughs up light yellow sputum.  She states her SOB is "kind of random"- noticed more with colder weather.   finishing prednisone now but still having severe coughing fits day and night, changes in temp/ perfumes. Mostly sob during severe coughing fits rec Pantoprazole (protonix) 40 mg   Take  30-60 min before first meal of the day and Pepcid (famotidine)  20 mg one @  bedtime until return to office - this is the best way to tell whether stomach acid is contributing to your problem.   Stop allegra d and For nasal drainage / throat tickle try take CHLORPHENIRAMINE  4 mg - take one every 4 hours as needed - available over the counter- may cause drowsiness so start with just a bedtime dose or two and see how you tolerate it before trying in daytime   Add singulair 10 mg each pm until return  Take delsym two tsp every 12 hours and supplement if needed with demerol  50 mg up to 1-2 every 4 hours to suppress the urge to cough. Swallowing water or using ice  chips/non mint and menthol containing candies (such as lifesavers or sugarless jolly ranchers) are also effective.  You should rest your voice and avoid activities that you know make you cough. Once you have eliminated the cough for 3 straight days try reducing the demerol first,  then the delsym as tolerated.   GERD  Diet/   schedule sinus CT asap > neg  Please schedule a follow up office visit in 2 weeks, sooner if needed  with all medications /inhalers/ solutions in hand so we can verify exactly what you are taking. This includes all medications from all doctors and over the counters   08/22/2017  f/u ov/Faydra Korman re: asthma/ rhinitis maint symb 80 2bid/singulair min need for prns since last ov  Chief Complaint  Patient presents with   Follow-up    Breathing is overall doing well. She has been able to start exercising some. She is using her albuterol inhaler 1 x per wk on average.   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  Remember to blow symbicort out thru nose  Plan B = Backup Only use your albuterol as a rescue medication     09/28/2018 acute extended ov/Zian Mohamed re: acute nasal symptoms/ chronic asthma maint on symb 80/singulair  Chief Complaint  Patient presents with   Acute Visit    She c/o sinus pressure/HA and nasal congestion for the past wk. She uses her albuterol inhaler once per wk on average.   despite using  flonase daily now with gradually worsening nasal congestion/ pain R frontal/R ear no purulent secretions/ h1 not helping / has not returned to ENT as prev rec rec If not opening up > Prednisone 10 mg take  4 each am x 2 days,   2 each am x 2  days,  1 each am x 2 days and stop  If not better you need to see Dr Wilburn Cornelia  Work on inhaler technique:   To get the most out of exercise, you need to be continuously aware that you are short of breath      10/01/2019  f/u ov/Rosalynn Sergent re:  Asthma/ chronic rhinitis maint on symb 80/ singulair  Chief Complaint  Patient presents with   Follow-up    Breathing is overall doing well. She rarely uses her rescue inhaler.  Dyspnea:  Mostly walking neighboorhood with [place] moderate pace with dogs / starting step aerobics  Cough: rarely though tends to cough in am x 2 and done  Sleeping: occ wakes up chest tight resolves usually on its own, once a week SABA use: once or twice a week  02: none  rec No change in medications.  Work on perfect  inhaler technique:    You should opt out of working other than  from home as long as possible and and only  to return under CDC workplace restrictions that 100% followed.    12/31/2020  f/u ov/Gal Feldhaus re: asthma/ chronic rhinitis on symb 18 and singulair  Chief Complaint  Patient presents with   Follow-up    Breathing is overall doing well. She states that she rarely uses her rescue inhaler.    Dyspnea:  Not limited by breathing from desired activities   Cough: some with ex  Sleeping: no problem SABA use: rarely  02: none  Lots of nasal congestion, L side never opens compltely   Rec Dulera 100 (=symbicort 80)  Take 2 puffs first thing in am and then another 2 puffs about 12 hours later  Virtual visit  02/24/22 recs I emphasized that nasal steroids have no immediate benefit   For cough/ congestion >  mucinex D twice daily and stop chlorpheniramine  Augmentin 875 mg take one pill twice daily  X 10 days  Prednisone 10 mg take  4 each am x 2 days,   2 each am x 2 days,  1 each am x 2 days and stop  Make an appt to see Wilburn Cornelia at your earliest convenience   Keep the candy handy - no mint menthol or chocolate    Shoemaker surgy 05/21/22  1.  Nasal  Septoplasty 2.  Bilateral Inferior Turbinate Reduction     05/27/2022  f/u ov/Linsey Arteaga re: ***   maint on ***  No chief complaint on file.   Dyspnea:  *** Cough: *** Sleeping: *** SABA use: *** 02: *** Covid status:   ***   No obvious day to day or  daytime variability or assoc excess/ purulent sputum or mucus plugs or hemoptysis or cp or chest tightness, subjective wheeze or overt sinus or hb symptoms.   *** without nocturnal  or early am exacerbation  of respiratory  c/o's or need for noct saba. Also denies any obvious fluctuation of symptoms with weather or environmental changes or other aggravating or alleviating factors except as outlined above   No unusual exposure hx or h/o childhood pna/ asthma or knowledge of premature birth.  Current Allergies, Complete Past Medical History, Past Surgical History, Family History, and Social History were reviewed in [employer] record.  ROS  The following are not active complaints unless bolded Hoarseness, sore throat, dysphagia, dental problems, itching, sneezing,  nasal congestion or discharge of excess mucus or purulent secretions, ear ache,   fever, chills, sweats, unintended wt loss or wt gain, classically pleuritic or exertional cp,  orthopnea pnd or arm/hand swelling  or leg swelling, presyncope, palpitations, abdominal pain, anorexia, nausea, vomiting, diarrhea  or change in bowel habits or change in bladder habits, change in stools or change in urine, dysuria, hematuria,  rash, arthralgias, visual complaints, headache, numbness, weakness or ataxia or problems with walking or coordination,  change in mood or  memory.        No outpatient medications have been marked as taking for the 05/27/22 encounter (Appointment) with Tanda Rockers, MD.                    Objective:   Physical Exam  05/27/2022       ***  12/31/2020         206  10/01/2019         199 09/28/2018     204  08/22/2017       199    04/15/2017        199   02/24/2017      193   02/11/17 199 lb (90.3 kg)  02/08/17 192 lb (87.1 kg)  11/02/16 194 lb 8 oz (88.2 kg)      Vital signs reviewed  05/27/2022  - Note at rest 02 sats  ***% on ***   General appearance:    ***                         Assessment:

## 2022-05-27 ENCOUNTER — Ambulatory Visit: Payer: 59 | Admitting: Internal Medicine
